# Patient Record
Sex: Female | Born: 1995 | Race: White | Hispanic: No | Smoking: Former smoker
Health system: Southern US, Community
[De-identification: ages and names within clinical notes are randomized; demographics above are authoritative.]

## PROBLEM LIST (undated history)

## (undated) DIAGNOSIS — F319 Bipolar disorder, unspecified: Secondary | ICD-10-CM

## (undated) DIAGNOSIS — G43109 Migraine with aura, not intractable, without status migrainosus: Secondary | ICD-10-CM

## (undated) DIAGNOSIS — E039 Hypothyroidism, unspecified: Secondary | ICD-10-CM

## (undated) DIAGNOSIS — F410 Panic disorder [episodic paroxysmal anxiety] without agoraphobia: Secondary | ICD-10-CM

## (undated) DIAGNOSIS — Z62810 Personal history of physical and sexual abuse in childhood: Secondary | ICD-10-CM

## (undated) DIAGNOSIS — R443 Hallucinations, unspecified: Secondary | ICD-10-CM

## (undated) DIAGNOSIS — R55 Syncope and collapse: Secondary | ICD-10-CM

## (undated) DIAGNOSIS — I959 Hypotension, unspecified: Secondary | ICD-10-CM

## (undated) DIAGNOSIS — Z8719 Personal history of other diseases of the digestive system: Secondary | ICD-10-CM

## (undated) HISTORY — DX: Hallucinations, unspecified: R44.3

## (undated) HISTORY — DX: Personal history of physical and sexual abuse in childhood: Z62.810

## (undated) HISTORY — DX: Personal history of other diseases of the digestive system: Z87.19

## (undated) HISTORY — DX: Panic disorder (episodic paroxysmal anxiety): F41.0

## (undated) HISTORY — DX: Bipolar disorder, unspecified: F31.9

## (undated) HISTORY — DX: Syncope and collapse: R55

## (undated) HISTORY — DX: Hypotension, unspecified: I95.9

## (undated) HISTORY — DX: Migraine with aura, not intractable, without status migrainosus: G43.109

---
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
## 2016-01-05 ENCOUNTER — Encounter: Payer: Self-pay | Admitting: Certified Nurse Midwife

## 2016-01-05 ENCOUNTER — Ambulatory Visit (INDEPENDENT_AMBULATORY_CARE_PROVIDER_SITE_OTHER): Payer: 59 | Admitting: Certified Nurse Midwife

## 2016-01-05 VITALS — BP 94/60 | HR 68 | Resp 16 | Ht 61.75 in | Wt 106.0 lb

## 2016-01-05 DIAGNOSIS — Z01419 Encounter for gynecological examination (general) (routine) without abnormal findings: Secondary | ICD-10-CM

## 2016-01-05 DIAGNOSIS — Z30011 Encounter for initial prescription of contraceptive pills: Secondary | ICD-10-CM | POA: Diagnosis not present

## 2016-01-05 MED ORDER — NORETHIN ACE-ETH ESTRAD-FE 1-20 MG-MCG PO TABS: 1.0000 | ORAL_TABLET | Freq: Every day | ORAL | Status: DC
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
## 2016-01-05 NOTE — Patient Instructions (Signed)
General topics  Next pap or exam is  due in 1 year Take a Women's multivitamin Take 1200 mg. of calcium daily - prefer dietary If any concerns in interim to call back  Breast Self-Awareness Practicing breast self-awareness may pick up problems early, prevent significant medical complications, and possibly save your life. By practicing breast self-awareness, you can become familiar with how your breasts look and feel and if your breasts are changing. This allows you to notice changes early. It can also offer you some reassurance that your breast health is good. One way to learn what is normal for your breasts and whether your breasts are changing is to do a breast self-exam. If you find a lump or something that was not present in the past, it is best to contact your caregiver right away. Other findings that should be evaluated by your caregiver include nipple discharge, especially if it is bloody; skin changes or reddening; areas where the skin seems to be pulled in (retracted); or new lumps and bumps. Breast pain is seldom associated with cancer (malignancy), but should also be evaluated by a caregiver. BREAST SELF-EXAM The best time to examine your breasts is 5 7 days after your menstrual period is over.  ExitCare Patient Information 2013 [employer].   Exercise to Stay Healthy Exercise helps you become and stay healthy. EXERCISE IDEAS AND TIPS Choose exercises that:  You enjoy.  Fit into your day. You do not need to exercise really hard to be healthy. You can do exercises at a slow or medium level and stay healthy. You can:  Stretch before and after working out.  Try yoga, Pilates, or tai chi.  Lift weights.  Walk fast, swim, jog, run, climb stairs, bicycle, dance, or rollerskate.  Take aerobic classes. Exercises that burn about 150 calories:  Running 1  miles in 15 minutes.  Playing volleyball for 45 to 60 minutes.  Washing and waxing a car for 45 to 60  minutes.  Playing touch football for 45 minutes.  Walking 1  miles in 35 minutes.  Pushing a stroller 1  miles in 30 minutes.  Playing basketball for 30 minutes.  Raking leaves for 30 minutes.  Bicycling 5 miles in 30 minutes.  Walking 2 miles in 30 minutes.  Dancing for 30 minutes.  Shoveling snow for 15 minutes.  Swimming laps for 20 minutes.  Walking up stairs for 15 minutes.  Bicycling 4 miles in 15 minutes.  Gardening for 30 to 45 minutes.  Jumping rope for 15 minutes.  Washing windows or floors for 45 to 60 minutes. Document Released: 12/28/2010 Document Revised: 02/17/2012 Document Reviewed: 12/28/2010 ExitCare Patient Information 2013 [employer]Care[employer].   Other topics ( that may be useful information):    Sexually Transmitted Disease Sexually transmitted disease (STD) refers to any infection that is passed from person to person during sexual activity. This may happen by way of saliva, semen, blood, vaginal mucus, or urine. Common STDs include:  Gonorrhea.  Chlamydia.  Syphilis.  HIV/AIDS.  Genital herpes.  Hepatitis B and C.  Trichomonas.  Human papillomavirus (HPV).  Pubic lice. CAUSES  An STD may be spread by bacteria, virus, or parasite. A person can get an STD by:  Sexual intercourse with an infected person.  Sharing sex toys with an infected person.  Sharing needles with an infected person.  Having intimate contact with the genitals, mouth, or rectal areas of an infected person. SYMPTOMS  Some people may not have any symptoms, but   they can still pass the infection to others. Different STDs have different symptoms. Symptoms include:  Painful or bloody urination.  Pain in the pelvis, abdomen, vagina, anus, throat, or eyes.  Skin rash, itching, irritation, growths, or sores (lesions). These usually occur in the genital or anal area.  Abnormal vaginal discharge.  Penile discharge in men.  Soft, flesh-colored skin growths in the  genital or anal area.  Fever.  Pain or bleeding during sexual intercourse.  Swollen glands in the groin area.  Yellow skin and eyes (jaundice). This is seen with hepatitis. DIAGNOSIS  To make a diagnosis, your caregiver may:  Take a medical history.  Perform a physical exam.  Take a specimen (culture) to be examined.  Examine a sample of discharge under a microscope.  Perform blood test TREATMENT   Chlamydia, gonorrhea, trichomonas, and syphilis can be cured with antibiotic medicine.  Genital herpes, hepatitis, and HIV can be treated, but not cured, with prescribed medicines. The medicines will lessen the symptoms.  Genital warts from HPV can be treated with medicine or by freezing, burning (electrocautery), or surgery. Warts may come back.  HPV is a virus and cannot be cured with medicine or surgery.However, abnormal areas may be followed very closely by your caregiver and may be removed from the cervix, vagina, or vulva through office procedures or surgery. If your diagnosis is confirmed, your recent sexual partners need treatment. This is true even if they are symptom-free or have a negative culture or evaluation. They should not have sex until their caregiver says it is okay. HOME CARE INSTRUCTIONS  All sexual partners should be informed, tested, and treated for all STDs.  Take your antibiotics as directed. Finish them even if you start to feel better.  Only take over-the-counter or prescription medicines for pain, discomfort, or fever as directed by your caregiver.  Rest.  Eat a balanced diet and drink enough fluids to keep your urine clear or pale yellow.  Do not have sex until treatment is completed and you have followed up with your caregiver. STDs should be checked after treatment.  Keep all follow-up appointments, Pap tests, and blood tests as directed by your caregiver.  Only use latex condoms and water-soluble lubricants during sexual activity. Do not use  petroleum jelly or oils.  Avoid alcohol and illegal drugs.  Get vaccinated for HPV and hepatitis. If you have not received these vaccines in the past, talk to your caregiver about whether one or both might be right for you.  Avoid risky sex practices that can break the skin. The only way to avoid getting an STD is to avoid all sexual activity.Latex condoms and dental dams (for oral sex) will help lessen the risk of getting an STD, but will not completely eliminate the risk. SEEK MEDICAL CARE IF:   You have a fever.  You have any new or worsening symptoms. Document Released: 02/15/2003 Document Revised: 02/17/2012 Document Reviewed: 02/22/2011 [facility] Patient Information 2013 [place].    Domestic Abuse You are being battered or abused if someone close to you hits, pushes, or physically hurts you in any way. You also are being abused if you are forced into activities. You are being sexually abused if you are forced to have sexual contact of any kind. You are being emotionally abused if you are made to feel worthless or if you are constantly threatened. It is important to remember that help is available. No one has the right to abuse you. PREVENTION OF FURTHER  ABUSE  Learn the warning signs of danger. This varies with situations but may include: the use of alcohol, threats, isolation from friends and family, or forced sexual contact. Leave if you feel that violence is going to occur.  If you are attacked or beaten, report it to the police so the abuse is documented. You do not have to press charges. The police can protect you while you or the attackers are leaving. Get the officer's name and badge number and a copy of the report.  Find someone you can trust and tell them what is happening to you: your caregiver, a nurse, clergy member, close friend or family member. Feeling ashamed is natural, but remember that you have done nothing wrong. No one deserves abuse. Document Released:  11/22/2000 Document Revised: 02/17/2012 Document Reviewed: 01/31/2011 ExitCare Patient Information 2013 ExitCare[employer].    How Much is Too Much Alcohol? Drinking too much alcohol can cause injury, accidents, and health problems. These types of problems can include:   Car crashes.  Falls.  Family fighting (domestic violence).  Drowning.  Fights.  Injuries.  Burns.  Damage to certain organs.  Having a baby with birth defects. ONE DRINK CAN BE TOO MUCH WHEN YOU ARE:  Working.  Pregnant or breastfeeding.  Taking medicines. Ask your doctor.  Driving or planning to drive. If you or someone you know has a drinking problem, get help from a doctor.  Document Released: 09/21/2009 Document Revised: 02/17/2012 Document Reviewed: 09/21/2009 ExitCare Patient Information 2013 ExitCare[employer].   Smoking Hazards Smoking cigarettes is extremely bad for your health. Tobacco smoke has over 200 known poisons in it. There are over 60 chemicals in tobacco smoke that cause cancer. Some of the chemicals found in cigarette smoke include:   Cyanide.  Benzene.  Formaldehyde.  Methanol (wood alcohol).  Acetylene (fuel used in welding torches).  Ammonia. Cigarette smoke also contains the poisonous gases nitrogen oxide and carbon monoxide.  Cigarette smokers have an increased risk of many serious medical problems and Smoking causes approximately:  90% of all lung cancer deaths in men.  80% of all lung cancer deaths in women.  90% of deaths from chronic obstructive lung disease. Compared with nonsmokers, smoking increases the risk of:  Coronary heart disease by 2 to 4 times.  Stroke by 2 to 4 times.  Men developing lung cancer by 23 times.  Women developing lung cancer by 13 times.  Dying from chronic obstructive lung diseases by 12 times.  . Smoking is the most preventable cause of death and disease in our society.  WHY IS SMOKING ADDICTIVE?  Nicotine is the chemical  agent in tobacco that is capable of causing addiction or dependence.  When you smoke and inhale, nicotine is absorbed rapidly into the bloodstream through your lungs. Nicotine absorbed through the lungs is capable of creating a powerful addiction. Both inhaled and non-inhaled nicotine may be addictive.  Addiction studies of cigarettes and spit tobacco show that addiction to nicotine occurs mainly during the teen years, when young people begin using tobacco products. WHAT ARE THE BENEFITS OF QUITTING?  There are many health benefits to quitting smoking.   Likelihood of developing cancer and heart disease decreases. Health improvements are seen almost immediately.  Blood pressure, pulse rate, and breathing patterns start returning to normal soon after quitting. QUITTING SMOKING   American Lung Association - 1-800-LUNGUSA  American Cancer Society - 1-800-ACS-2345 Document Released: 01/02/2005 Document Revised: 02/17/2012 Document Reviewed: 09/06/2009 ExitCare Patient Information 2013 ExitCare,   LLC.   Stress Management Stress is a state of physical or mental tension that often results from changes in your life or normal routine. Some common causes of stress are:  Death of a loved one.  Injuries or severe illnesses.  Getting fired or changing jobs.  Moving into a new home. Other causes may be:  Sexual problems.  Business or financial losses.  Taking on a large debt.  Regular conflict with someone at home or at work.  Constant tiredness from lack of sleep. It is not just bad things that are stressful. It may be stressful to:  Win the lottery.  Get married.  Buy a new car. The amount of stress that can be easily tolerated varies from person to person. Changes generally cause stress, regardless of the types of change. Too much stress can affect your health. It may lead to physical or emotional problems. Too little stress (boredom) may also become stressful. SUGGESTIONS TO  REDUCE STRESS:  Talk things over with your family and friends. It often is helpful to share your concerns and worries. If you feel your problem is serious, you may want to get help from a professional counselor.  Consider your problems one at a time instead of lumping them all together. Trying to take care of everything at once may seem impossible. List all the things you need to do and then start with the most important one. Set a goal to accomplish 2 or 3 things each day. If you expect to do too many in a single day you will naturally fail, causing you to feel even more stressed.  Do not use alcohol or drugs to relieve stress. Although you may feel better for a short time, they do not remove the problems that caused the stress. They can also be habit forming.  Exercise regularly - at least 3 times per week. Physical exercise can help to relieve that "uptight" feeling and will relax you.  The shortest distance between despair and hope is often a good night's sleep.  Go to bed and get up on time allowing yourself time for appointments without being rushed.  Take a short "time-out" period from any stressful situation that occurs during the day. Close your eyes and take some deep breaths. Starting with the muscles in your face, tense them, hold it for a few seconds, then relax. Repeat this with the muscles in your neck, shoulders, hand, stomach, back and legs.  Take good care of yourself. Eat a balanced diet and get plenty of rest.  Schedule time for having fun. Take a break from your daily routine to relax. HOME CARE INSTRUCTIONS   Call if you feel overwhelmed by your problems and feel you can no longer manage them on your own.  Return immediately if you feel like hurting yourself or someone else. Document Released: 05/21/2001 Document Revised: 02/17/2012 Document Reviewed: 01/11/2008 [facility] Patient Information 2013 [place].

## 2016-01-07 NOTE — Progress Notes (Signed)
Reviewed personally.  M. Suzanne Jacobe Study, MD.  

## 2016-02-07 ENCOUNTER — Telehealth: Payer: Self-pay | Admitting: Certified Nurse Midwife

## 2016-02-07 NOTE — Telephone Encounter (Addendum)
Patient's mom Foye Clock called to talk with the nurse about her daughter. She is having irregular bleeding and fainted last week. She is on the DPR to speak with her.

## 2016-02-07 NOTE — Telephone Encounter (Signed)
Returned call to mother Foye Clock at number provided [phone]. Left message to call Kaitlyn at [phone].

## 2016-02-09 NOTE — Telephone Encounter (Signed)
Left message to call Kunal Levario at [phone]. 

## 2016-02-12 NOTE — Telephone Encounter (Signed)
Left message to call Burnette Valenti at [phone]. 

## 2016-02-15 NOTE — Telephone Encounter (Signed)
Leota Sauerseborah Leonard CNM, I have attempted to reach the patient's mother Foye ClockKristina x 3 with no return call. Patient's telephone number listed on DPR is the same as her mothers. I have not received a return call. Please advise next steps.

## 2016-02-15 NOTE — Telephone Encounter (Signed)
Do not see any outside notes from ER, so would assume if still a problem she would call. We could send a letter, but think we can close encounter

## 2016-02-16 ENCOUNTER — Ambulatory Visit (INDEPENDENT_AMBULATORY_CARE_PROVIDER_SITE_OTHER): Payer: 59 | Admitting: Certified Nurse Midwife

## 2016-02-16 ENCOUNTER — Encounter: Payer: Self-pay | Admitting: Certified Nurse Midwife

## 2016-02-16 VITALS — BP 100/60 | HR 68 | Temp 98.4°F | Resp 16 | Ht 61.75 in | Wt 111.0 lb

## 2016-02-16 DIAGNOSIS — Z113 Encounter for screening for infections with a predominantly sexual mode of transmission: Secondary | ICD-10-CM | POA: Diagnosis not present

## 2016-02-16 DIAGNOSIS — R309 Painful micturition, unspecified: Secondary | ICD-10-CM

## 2016-02-16 DIAGNOSIS — A499 Bacterial infection, unspecified: Secondary | ICD-10-CM

## 2016-02-16 DIAGNOSIS — N760 Acute vaginitis: Secondary | ICD-10-CM

## 2016-02-16 DIAGNOSIS — B9689 Other specified bacterial agents as the cause of diseases classified elsewhere: Secondary | ICD-10-CM

## 2016-02-16 LAB — POCT URINALYSIS DIPSTICK
Bilirubin, UA: NEGATIVE
Blood, UA: NEGATIVE
Glucose, UA: NEGATIVE
Ketones, UA: NEGATIVE
Nitrite, UA: NEGATIVE
Protein, UA: NEGATIVE
Urobilinogen, UA: NEGATIVE
pH, UA: 5

## 2016-02-16 MED ORDER — METRONIDAZOLE 500 MG PO TABS: 500.0000 mg | ORAL_TABLET | Freq: Two times a day (BID) | ORAL | Status: DC

## 2016-02-16 NOTE — Patient Instructions (Signed)

## 2016-02-16 NOTE — Progress Notes (Signed)
20 y.o. Single Caucasian female G0P0000 here with complaint of urethral pain? and some bleeding noted from area and in toilet. Sexually active prior to occurrence, with oral stimulation and hand stimulation with new partner. Denies fever, chills, nausea or back pain.  Complaining of vaginal symptoms of itching, burning, and increase discharge. Describes discharge as watery, no odor.Onset of symptoms 3 days ago. Denies new personal products. Has  STD concerns, new partner, desires STD screening vaginal only. Urinary symptoms none . Contraception is OCP.   O:Healthy female WDWN Affect: normal, orientation x 3  Exam:skin: warm and dry CVAT negative bilateral Abdomen: soft, non tender, negative suprapubic Lymph node: no enlargement or tenderness Pelvic exam: External genital: normal female,  No lesions BUS: negative Bladder and urethral meatus non tender, no blood noted Small healing superficial laceration noted under clitoral hood, slightly tender Vagina: grey watery  discharge noted. Ph:4.5   ,Wet prep taken, Affirm taken Cervix: normal, non tender,very slight CMT Uterus: normal, non tender Adnexa:normal, non tender, no masses or fullness noted   Wet Prep results: positive for clue   A:Normal pelvic exam BV STD screening Superficial laceration under clitoral hood  R/O UTI   P:Discussed findings of BV and etiology. Discussed Aveeno or baking soda sitz bath for comfort.  Rx Flagyl see order with instructions Discussed superficial laceration noted under clitoral hood and avoid hand or oral stimulation to allow healing. Would recommend avoiding to avoid trauma to area. Sitz bath will help with comfort. Stressed condom use for STD protection Lab: GC,Chlamydia, affirm, UTI warning signs given and need to advise. Lab; Urine culture   Rv prn

## 2016-02-17 LAB — WET PREP BY MOLECULAR PROBE
Candida species: POSITIVE — AB
Gardnerella vaginalis: NEGATIVE
Trichomonas vaginosis: NEGATIVE

## 2016-02-17 LAB — URINE CULTURE: Colony Count: 8000

## 2016-02-18 ENCOUNTER — Other Ambulatory Visit: Payer: Self-pay | Admitting: Certified Nurse Midwife

## 2016-02-18 DIAGNOSIS — B3731 Acute candidiasis of vulva and vagina: Secondary | ICD-10-CM

## 2016-02-18 DIAGNOSIS — B373 Candidiasis of vulva and vagina: Secondary | ICD-10-CM

## 2016-02-18 MED ORDER — TERCONAZOLE 0.4 % VA CREA: 1.0000 | TOPICAL_CREAM | Freq: Every day | VAGINAL | Status: DC

## 2016-02-20 LAB — IPS N GONORRHOEA AND CHLAMYDIA BY PCR

## 2016-02-21 NOTE — Progress Notes (Signed)
Encounter reviewed Dana Julian, MD   

## 2016-03-04 ENCOUNTER — Ambulatory Visit (INDEPENDENT_AMBULATORY_CARE_PROVIDER_SITE_OTHER): Payer: 59 | Admitting: Family Medicine

## 2016-03-04 ENCOUNTER — Encounter: Payer: Self-pay | Admitting: Family Medicine

## 2016-03-04 VITALS — BP 98/64 | HR 60 | Temp 98.4°F | Wt 112.4 lb

## 2016-03-04 DIAGNOSIS — Z7189 Other specified counseling: Secondary | ICD-10-CM | POA: Diagnosis not present

## 2016-03-04 DIAGNOSIS — R6889 Other general symptoms and signs: Secondary | ICD-10-CM | POA: Diagnosis not present

## 2016-03-04 DIAGNOSIS — R55 Syncope and collapse: Secondary | ICD-10-CM | POA: Diagnosis not present

## 2016-03-04 DIAGNOSIS — Z7689 Persons encountering health services in other specified circumstances: Secondary | ICD-10-CM

## 2016-03-04 DIAGNOSIS — I951 Orthostatic hypotension: Secondary | ICD-10-CM | POA: Diagnosis not present

## 2016-03-04 DIAGNOSIS — Z862 Personal history of diseases of the blood and blood-forming organs and certain disorders involving the immune mechanism: Secondary | ICD-10-CM

## 2016-03-04 DIAGNOSIS — F319 Bipolar disorder, unspecified: Secondary | ICD-10-CM

## 2016-03-04 LAB — CBC WITH DIFFERENTIAL/PLATELET
Basophils Absolute: 0 10*3/uL (ref 0.0–0.1)
Basophils Relative: 0 % (ref 0–1)
Eosinophils Absolute: 0.1 10*3/uL (ref 0.0–0.7)
Eosinophils Relative: 1 % (ref 0–5)
HCT: 40.9 % (ref 36.0–46.0)
Hemoglobin: 13.7 g/dL (ref 12.0–15.0)
Lymphocytes Relative: 36 % (ref 12–46)
Lymphs Abs: 2.2 10*3/uL (ref 0.7–4.0)
MCH: 28.7 pg (ref 26.0–34.0)
MCHC: 33.5 g/dL (ref 30.0–36.0)
MCV: 85.6 fL (ref 78.0–100.0)
MPV: 9.4 fL (ref 8.6–12.4)
Monocytes Absolute: 0.4 10*3/uL (ref 0.1–1.0)
Monocytes Relative: 7 % (ref 3–12)
Neutro Abs: 3.4 10*3/uL (ref 1.7–7.7)
Neutrophils Relative %: 56 % (ref 43–77)
Platelets: 292 10*3/uL (ref 150–400)
RBC: 4.78 MIL/uL (ref 3.87–5.11)
RDW: 12.7 % (ref 11.5–15.5)
WBC: 6 10*3/uL (ref 4.0–10.5)

## 2016-03-04 NOTE — Progress Notes (Signed)
Subjective:    Patient ID: Dana Fisher, female    DOB: 05-01-1996, 20 y.o.   MRN: [id number]  HPI Chief Complaint  Patient presents with  . new pt    new pt fainting friday- on new bipolor meds back in january, and fainted twice- feels bp dropping, dizzy,couldn't see anything. had fainting spells back in elemenary and middle school. had pediatric cardiology work up and everything was fine several years ago   She is an 20 year old female with a history of Bipolar disorder, GAD, panic disorder, hypotension, paranoia, and SI attempt in 11/2015, who is new to the practice and here to establish primary care. States she has been having dizzy episodes and "blacking out". States she has had 2 episodes, both at work and 1st one 1 month ago and the last 3 days ago. Reports history of same but states no episodes since 8th grade, which has been approximately 3-4 years. States 1 month ago she was at work and was on her menstrual cycle and had severe cramping. Felt cold and clammy and then she passed out, landed on the floor, witnessed. Denies being injured. Ems was not called. She remembers waking up on the floor.  She states Friday she was at work and she felt "numb" and her vision went black, felt like her blood pressure was low. She reports waking up on the floor again and EMS was not called and she did not go for medical evaluation. She denies injury either episode.  She states today she feels fine. Denies fever, chills, headache, dizziness, chest pain, palpitations, shortness of breath, GI or GU symptoms.  Has not been to primary care. Has seen pediatrician in past at [facility] but several years ago. No records with her today.  Past medical history: states she was diagnosed with "low blood pressure" and this was treated with high salt diet and high fluids.  Has been feeling really cold for the past 2 weeks.   States she was diagnosed with bipolar in January and started new medications in  January. She is under the care of Psychiatrist at [facility].   She is working at [employer] and has been having more caffeine than usual. Her mother thinks this may be related to her syncopal episodes.  States she snacks often, eats a lot of pancakes, hot pockets, bacon, eggs, panini, states she is lactose intolerant.  Ports history of anemia and was prescribed daily iron. She states she takes this occasionally.  Mother states she was evaluated by a pediatric cardiologist in past at age 90-11. He diagnosed her with orthostatic hypotension and  ? Long Q-T.    Takes OCPs.  LMP: 1 week ago.   Other providers: Sara Chu- [facility]. Psychiatrist Shelba Flake at [facility].   Denies smoking, alcohol use. Marijuana once daily.    Review of Systems Pertinent positives and negatives in the history of present illness.     Objective:   Physical Exam Wt 112 lb 6.4 oz (50.984 kg)  LMP 01/26/2016  Alert and in no distress.  Cardiac exam shows a regular sinus rhythm without murmurs or gallops. Lungs are clear to auscultation.   Orthostatic blood pressures done: she is not postural ECG: indication syncope, history of hypotension NSR Rate 64    Assessment & Plan:  Syncope, unspecified syncope type - Plan: EKG 12-Lead, CBC with Differential/Platelet, Comprehensive metabolic panel, Orthostatic vital signs, CANCELED: POCT urine pregnancy  Encounter to establish care  Orthostatic hypotension -  Plan: Orthostatic vital signs  History of anemia - Plan: CBC with Differential/Platelet  Cold intolerance - Plan: TSH  Bipolar I disorder (HCC)  Discussed that I would like to get her medical records from her pediatrician and pediatric cardiologist. She is not orthostatic today and her EKG is unremarkable. Recommend lab work and she will let me know if she has another episode. Discussed that ideally she would be able to have her vitals taken if she does have another episode since she  feels like low blood pressure is the cause for her syncopal episodes. Recommend that she continue on high salt and make sure he or she is drinking plenty of fluids since this has been recommended in the past for her symptoms.  Discussed that I will defer to her psychiatrist for treatment of bipolar and anxiety. Follow-up pending blood work and medical records.

## 2016-03-05 ENCOUNTER — Telehealth: Payer: Self-pay | Admitting: Family Medicine

## 2016-03-05 LAB — COMPREHENSIVE METABOLIC PANEL
ALT: 7 U/L (ref 5–32)
AST: 13 U/L (ref 12–32)
Albumin: 4 g/dL (ref 3.6–5.1)
Alkaline Phosphatase: 44 U/L — ABNORMAL LOW (ref 47–176)
BUN: 8 mg/dL (ref 7–20)
CO2: 23 mmol/L (ref 20–31)
Calcium: 9.1 mg/dL (ref 8.9–10.4)
Chloride: 105 mmol/L (ref 98–110)
Creat: 0.68 mg/dL (ref 0.50–1.00)
Glucose, Bld: 80 mg/dL (ref 65–99)
Potassium: 4 mmol/L (ref 3.8–5.1)
Sodium: 138 mmol/L (ref 135–146)
Total Bilirubin: 0.5 mg/dL (ref 0.2–1.1)
Total Protein: 6.6 g/dL (ref 6.3–8.2)

## 2016-03-05 LAB — TSH: TSH: 3.3 mIU/L (ref 0.50–4.30)

## 2016-03-05 NOTE — Telephone Encounter (Signed)
[facility]/office notes, immunizations

## 2016-04-29 ENCOUNTER — Ambulatory Visit (HOSPITAL_COMMUNITY)
Admission: RE | Admit: 2016-04-29 | Discharge: 2016-04-29 | Disposition: A | Discharge status: Home / Self Care | Payer: 59 | Attending: Psychiatry | Admitting: Psychiatry

## 2016-04-29 DIAGNOSIS — R55 Syncope and collapse: Secondary | ICD-10-CM | POA: Insufficient documentation

## 2016-04-29 DIAGNOSIS — F319 Bipolar disorder, unspecified: Secondary | ICD-10-CM | POA: Insufficient documentation

## 2016-04-29 DIAGNOSIS — F12288 Cannabis dependence with other cannabis-induced disorder: Secondary | ICD-10-CM | POA: Diagnosis not present

## 2016-04-29 DIAGNOSIS — R443 Hallucinations, unspecified: Secondary | ICD-10-CM | POA: Diagnosis not present

## 2016-04-29 DIAGNOSIS — I959 Hypotension, unspecified: Secondary | ICD-10-CM | POA: Insufficient documentation

## 2016-04-29 DIAGNOSIS — F17210 Nicotine dependence, cigarettes, uncomplicated: Secondary | ICD-10-CM | POA: Insufficient documentation

## 2016-04-29 DIAGNOSIS — Z62810 Personal history of physical and sexual abuse in childhood: Secondary | ICD-10-CM | POA: Diagnosis not present

## 2016-04-29 DIAGNOSIS — F410 Panic disorder [episodic paroxysmal anxiety] without agoraphobia: Secondary | ICD-10-CM | POA: Insufficient documentation
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
## 2016-04-30 ENCOUNTER — Other Ambulatory Visit (HOSPITAL_COMMUNITY): Payer: 59 | Admitting: Licensed Clinical Social Worker

## 2016-04-30 DIAGNOSIS — F3181 Bipolar II disorder: Secondary | ICD-10-CM | POA: Insufficient documentation

## 2016-04-30 DIAGNOSIS — F32A Depression, unspecified: Secondary | ICD-10-CM

## 2016-04-30 DIAGNOSIS — F411 Generalized anxiety disorder: Secondary | ICD-10-CM

## 2016-04-30 DIAGNOSIS — F329 Major depressive disorder, single episode, unspecified: Secondary | ICD-10-CM

## 2016-05-01 ENCOUNTER — Other Ambulatory Visit (HOSPITAL_COMMUNITY): Payer: 59 | Attending: Psychiatry | Admitting: Licensed Clinical Social Worker

## 2016-05-01 ENCOUNTER — Other Ambulatory Visit (HOSPITAL_COMMUNITY): Payer: 59 | Admitting: Licensed Clinical Social Worker

## 2016-05-01 DIAGNOSIS — F3181 Bipolar II disorder: Secondary | ICD-10-CM | POA: Diagnosis present
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
## 2016-05-02 ENCOUNTER — Encounter (HOSPITAL_COMMUNITY): Payer: Self-pay | Admitting: Licensed Clinical Social Worker

## 2016-05-02 ENCOUNTER — Other Ambulatory Visit (HOSPITAL_COMMUNITY): Payer: 59 | Admitting: Licensed Clinical Social Worker

## 2016-05-02 ENCOUNTER — Other Ambulatory Visit (HOSPITAL_COMMUNITY): Payer: 59

## 2016-05-02 DIAGNOSIS — F3181 Bipolar II disorder: Secondary | ICD-10-CM

## 2016-05-02 MED ORDER — OLANZAPINE 10 MG PO TABS: 10.0000 mg | ORAL_TABLET | Freq: Every day | ORAL | Status: DC

## 2016-05-02 MED ORDER — GABAPENTIN 100 MG PO CAPS: 100.0000 mg | ORAL_CAPSULE | Freq: Three times a day (TID) | ORAL | Status: DC

## 2016-05-02 NOTE — Progress Notes (Signed)
Group Note:  Patient attended the group today. Was withdrawn and quiet and did not volunteer  any information without being coaxed. Poor eye contact keeping her eyes fixed to the floor. Did say, after much encouragement, that the reason she was here in the hospital was because "I have bad thoughts and just don't want to be here". In a simple exercise where Pt's give their hearts to the leader so that they can only use their heads and not function from their hearts, Pt was able to state that she was indeed equal to others in the group. Affect remained flat and mood depressed throughout the group.
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
## 2016-05-02 NOTE — Psych (Signed)
   [facility] BH PHP THERAPIST PROGRESS NOTE  Dana Fisher [id number]  Session Time: 9 AM - 2 PM  Participation Level: Minimal  Behavioral Response: CasualAlertDepressed  Type of Therapy: Group Therapy  Treatment Goals addressed: Anxiety and Coping, Depression  Interventions: CBT, DBT, Strength-based, Supportive and Reframing  Summary: Clinician facilitated check-in regarding current stressors and situation, and review of patient completed diary card. Clinician utilized active listening and empathetic response and validated patient emotions. Group engaged in "Wear Sunscreen" mindfulness activity and discussed progress.  Clinician reviewed topic of emotional regulation and pointed out moments in which the skill could have been utilized.  Group members participated in activity with RN on choices and needs.  Group participated in graduation ceremony for fellow group member.  Group met with OT for group session.  Clinician assessed for immediate needs, medication compliance and efficacy, and safety concerns.    Suicidal/Homicidal: Nowithout intent/plan  Therapist Response:  Dana Fisher is a 20 y.o. female who presents with depression and anxiety symptoms. Patient was on time and low energy in group today. Patient reports "mild" anxiety and depression symptoms and that she felt "a little" sad and happy. Patient stated frustration after making a poor grade on her exam yesterday. Patient identifies she is able to retake the exam today to improve her grade and that it is open notes and she knows where to find the answers, however is not sure she will look for them as "it would take forever to go through all of the modules." Patient seeks to discuss risky behaviors in group such as past drug use and her previous job. Patient stops participating in group and responds with one word answers after clinician redirected her from those topics during check-in.  Patient was present for RN group, graduation  ceremony, and OT group.  Patient demonstrates lack of progress as evidenced by lack of participation and engagement in group. Patient was not disruptive, and exhibited treatment interfering behaviors. Patient denies SI/HI/self-harm thoughts.    Plan: Patient will continue in PHP and medication management. Work towards decreasing depression and anxiety symptoms and increase emotional regulation and positive coping skills   Diagnosis: Primary Diagnosis: Bipolar 2 disorder, major depressive episode ([place]) [F31.81]    1. Bipolar 2 disorder, major depressive episode ([place])       Lorin Glass, LCSW 05/02/2016

## 2016-05-03 ENCOUNTER — Other Ambulatory Visit (HOSPITAL_COMMUNITY): Payer: 59

## 2016-05-06 ENCOUNTER — Other Ambulatory Visit (HOSPITAL_COMMUNITY): Payer: Self-pay

## 2016-05-07 ENCOUNTER — Other Ambulatory Visit (HOSPITAL_COMMUNITY): Payer: Self-pay

## 2016-05-08 ENCOUNTER — Other Ambulatory Visit (HOSPITAL_COMMUNITY): Payer: 59 | Admitting: Psychiatry

## 2016-05-08 ENCOUNTER — Encounter (HOSPITAL_COMMUNITY): Payer: Self-pay

## 2016-05-08 DIAGNOSIS — F3181 Bipolar II disorder: Secondary | ICD-10-CM | POA: Diagnosis not present

## 2016-05-08 NOTE — Psych (Signed)
   [facility]  Dana Dibblelexis C Randon [id number]  Session Time: 9 AM - 2 PM  Participation Level: Active  Behavioral Response: CasualAlertDepressed  Type of Therapy: Group Therapy  Treatment Goals addressed: Anxiety and Coping, Depression  Interventions: CBT, DBT, Strength-based, Supportive and Reframing  Summary:  Clinician facilitated check-in regarding current stressors and situation, and review of patient completed diary card. Clinician utilized active listening and empathetic response and validated patient emotions. Clinician showed TED talk "The power of mindfulness; What you practice grows stronger" to explain the benefits of mindfulness on the brain. Group engaged in coloring mindfulness activity and discussed progress.  Clinician introduced topic of emotional regulation. Clinician utilized handout, "Emotional Regulation" and group reviewed the skills of "PLEASE," "Paying attention to Positive Events," "Opposite Action," and "Check the Facts." Clinician educated group on how each skill worked and when it might be used. Group provided examples of when they could utilize these skills.  Clinician facilitated art therapy activity of "mind mapping" in which each group member utilized multi-media to illustrate their inner mind. Group members presented their finished product to group and clinician facilitated conversation regarding their creative choices and their meaning.  Clinician assessed for immediate needs, medication compliance and efficacy, and safety concerns.   Suicidal/Homicidal: Nowithout intent/plan  Therapist Response: Dana Fisher is a 20 y.o. female who presents with depressive symptoms. Patient presented on time and with depressed affect. Patient reports feeling "fine I guess." Patient reports "neutral" feelings and states she felt "a little" sad and happy yesterday. Patient denies concerns or problems.  Patient minimally engaged in discussion regarding  emotional regulation and states she "stayed okay" and couldn't think of a time when she could use these skills. Patient appropriately engaged in art therapy activity. Patient presented a "mind map" that was hand-drawn, utilized memes and quotes she liked, and had 3-4 drug related pictures. Patient reports "that doesn't mean anything" and denies having issues with substances.  This is patient's first session of group. Patient had low energy, poor eye contact, and had incongruent affect as demonstrated by smiling when nothing was happening in group or when other group members were sharing problems. Patient denies SI/HI/self-harm thoughts.     Plan: Patient will continue in PHP and medication management. Work towards decreasing depression and anxiety symptoms and increase emotional regulation and positive coping skills.    Diagnosis: Primary Diagnosis: Bipolar 2 disorder, major depressive episode (HCC) [F31.81]    1. Bipolar 2 disorder, major depressive episode (HCC)       [facility] 05/08/2016

## 2016-05-09 ENCOUNTER — Encounter (HOSPITAL_COMMUNITY): Payer: Self-pay | Admitting: Licensed Clinical Social Worker

## 2016-05-09 ENCOUNTER — Encounter (HOSPITAL_COMMUNITY): Payer: Self-pay | Admitting: Psychiatry

## 2016-05-09 ENCOUNTER — Other Ambulatory Visit (HOSPITAL_COMMUNITY): Payer: 59 | Attending: Psychiatry | Admitting: Licensed Clinical Social Worker

## 2016-05-09 DIAGNOSIS — F3181 Bipolar II disorder: Secondary | ICD-10-CM | POA: Insufficient documentation

## 2016-05-09 NOTE — Progress Notes (Signed)
Patient ID: Dana Fisher, female   DOB: 05-24-1996, 20 y.o.   MRN: [id number] 05/08/16 PHP Psychiatry Progress Note   Duration- 20 minutes   Patient is a 20 year old single female, employed ,  reports history of Bipolar Spectrum Disorder, ADHD, history of Substance Abuse in the past . History of increased depression, anxiety recently.  Please refer to Dr. Lubertha Basqueaylor's initial psychiatric assessment   Subjective - patient reports she is still feeling depressed, anxious, but is improved compared to before, and feels that PHP support, therapy is helping . Denies medication side effects- currently on Zyprexa .   Objective - Patient reports generally improving mood , although reports some ongoing depression, anxiety.  Denies any current suicidal ideations or self injurious ideations. Denies recent drug use. At this time she is future oriented, states she is working on obtaining her HS diploma, and was hoping to go into [employer], but thinks this may not be possible due to her psychiatric history and being on psychiatric medications. She does want to continue on to college once she gets her diploma. 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 At this time denies medication side effects from Olanzapine, which she feels has been effective , does not endorse significant weight gain, sedation, or abnormal involuntary movements.  MSE- Alert, attentive, well groomed, calm, fair eye contact, speech normal in tone, volume, rate, no psychomotor agitation or retardation, mood described as improving , affect mildly constricted, but reactive and does smile appropriately at times, no thought disorder, denies suicidal ideations, denies self injurious ideations at this time, denies homicidal or violent ideations .  Denies hallucinations, no delusions expressed, does not appear guarded or paranoid .Not internally preoccupied. Future oriented . No akathisia noted, no abnormal involuntary movements  noted. Labs from 03/04/16 reviewed- unremarkable .   Assessment - patient reports history of bipolar spectrum disorder, most recently depressed, prior history of  self injurious behaviors, and history of substance abuse. At this time reports partial improvement of mood, states she is feeling better about herself, is more future oriented, and denies any current suicidal or self injurious  Ideations. Currently on Olanzapine, which she has tolerated well thus far .  Plan- continue PHP- as discussed with PHP therapist, disposition planning in progress- plan is to discharge to outpatient psychiatric management and therapy, particularly DBT focused . Does not need medication renewed at this time.  Side effects, to include risk of metabolic disturbances, sedation, weight gain, movement disorder, have been reviewed   Nehemiah MassedFernando Cobos, MD

## 2016-05-09 NOTE — Psych (Signed)
   [facility]  Miguel Dibblelexis C Khim [id number]  Session Time: 9 AM - 2 PM  Participation Level: Minimal  Behavioral Response: CasualAlertDepressed  Type of Therapy: Group Therapy  Treatment Goals addressed: Anxiety and Coping, Depression  Interventions: CBT, DBT, Strength-based, Supportive and Reframing  Summary: Clinician facilitated check-in regarding current stressors and situation, and review of patient completed diary card. Clinician utilized active listening and empathetic response and validated patient emotions. Group engaged in outdoors mindfulness activity and discussed progress.  Group completed "mask" activity in which they drew and discussed what they present to the world versus how they feel inside. Clinician introduced topic of boundaries and provided psycho-education regarding the topic. Clinician utilized handout "What are personal boundaries?" to discuss types of boundaries. Group members discussed how different boundaries played out in their own lives.  Clinician led activity of "boundary mapping" in which group members drew the ways in which their boundaries look with different people and concepts in their lives. Group members then completed the same activity for how they want their boundaries to look and discussed how to get there.  Clinician assessed for immediate needs, medication compliance and efficacy, and safety concerns.     Suicidal/Homicidal: Nowithout intent/plan  Therapist Response:  Miguel Dibblelexis C Guaman is a 20 y.o. female who presents with depression and anxiety symptoms. Patient was on time and low energy in group today. Patient had difficulty staying awake and responded moderately to redirection. Patient reports no anxiety and depression symptoms and that she felt "a little" sad and happy. Patient continues to be most animated when discussing risky behaviors including past drug use and reckless decision making.  Patient was moderately engaged  in mask activity. Patient reports understanding of boundaries. Patient completed boundary activity.  Patient demonstrates slight progress as evidenced by increased responsiveness to redirection. Patient was reminded multiple times of the need to be alert in group. Patient reports continued mood stability and states that her new medication is effective and she feels "a lot better" since beginning it. Patient discussed progress and upcoming discharge with clinician. Patient states comfort with discharge expected date and step down plan to engage in DBT program, accepted referrals to follow up with. Patient denies SI/HI/self-harm thoughts.     Plan: Patient will continue in PHP and medication management. Work towards decreasing depression and anxiety symptoms and increase emotional regulation and positive coping skills   Diagnosis: Primary Diagnosis: Bipolar 2 disorder, major depressive episode (HCC) [F31.81]    1. Bipolar 2 disorder, major depressive episode (HCC)       Donia GuilesJenny Kellin Fifer, LCSW 05/09/2016

## 2016-05-09 NOTE — Psych (Signed)
   [facility] BH PHP THERAPIST PROGRESS NOTE  [name] [id number]  Session Time: 9 AM - 2 PM  Participation Level: Minimal  Behavioral Response: CasualAlertDepressed  Type of Therapy: Group Therapy  Treatment Goals addressed: Anxiety and Coping, Depression  Interventions: CBT, DBT, Strength-based, Supportive and Reframing  Summary: Clinician facilitated check-in regarding current stressors and situation, and review of patient completed diary card. Clinician utilized active listening and empathetic response and validated patient emotions. Group engaged in guided meditation mindfulness activity and discussed progress.  Clinician led discussion regarding strengths and utilized handout "My Strengths and Qualities." Clinician processed with patients their difficulty with completing the sheet and challenged them to think past their negative feelings to past times where they may have felt more capable or "better."  RN led group session on goals.  Clinician led activity on "what I want my life to look like." Using art, patients identified what they want in the future and discussed how to achieve those things.  Group met with OT therapist for group session.  Clinician assessed for immediate needs, medication compliance and efficacy, and safety concerns.     Suicidal/Homicidal: Nowithout intent/plan  Therapist Response:  Dana Fisher is a 20 y.o. female who presents with depression and anxiety symptoms. Patient was on time and presented with low energy. Patient had difficulty staying alert. Patient reports feeling continued fatigue. Patient reports "mild" anxiety and depression symptoms and that she felt "a little" sad and happy. Patient increased participation as group progressed.  Patient struggled to identify positive traits about herself or what other people like about her. Patient appropriately engaged in activity and reports goals of travel, "being content," having pets, and independence  as future goals.  Patient engaged in group with RN and OT therapist.  Patient demonstrates progress as evidenced by decreased need for redirection. Patient presented with increased periods of brightened affect and increased verbal participation. Patient reports continued mood stability and medication efficacy. Patient reports continued comfort with discharge plan. Patient denies SI/HI/self-harm thoughts.    Plan: Patient will continue in PHP and medication management. Work towards decreasing depression and anxiety symptoms and increase emotional regulation and positive coping skills   Diagnosis: Primary Diagnosis: Bipolar 2 disorder, major depressive episode ([place]) [F31.81]    1. Bipolar 2 disorder, major depressive episode ([place])       Lorin Glass, LCSW 05/09/2016

## 2016-05-10 ENCOUNTER — Encounter (HOSPITAL_COMMUNITY): Payer: Self-pay | Admitting: Licensed Clinical Social Worker

## 2016-05-10 ENCOUNTER — Other Ambulatory Visit (HOSPITAL_COMMUNITY): Payer: 59 | Admitting: Licensed Clinical Social Worker

## 2016-05-10 DIAGNOSIS — F3181 Bipolar II disorder: Secondary | ICD-10-CM | POA: Diagnosis not present

## 2016-05-10 NOTE — Psych (Signed)
   [facility]  Dana Fisher [id number]  Session Time: 9 AM - 1 PM  Participation Level: Minimal  Behavioral Response: CasualAlertDepressed  Type of Therapy: Group Therapy  Treatment Goals addressed: Anxiety and Coping, Depression  Interventions: CBT, DBT, Strength-based, Supportive and Reframing  Summary: Clinician facilitated check-in regarding current stressors and situation, and review of patient completed diary card. Clinician utilized active listening and empathetic response and validated patient emotions. Group engaged in progressive muscle relaxation mindfulness activity and discussed progress.  Clinician introduced topic of trust. Clinician supervised counseling intern Whitney C., in facilitating discussion regarding topic of family relationships. Group completed genogram activity, to consider relationships within their family and had discussion regarding relationships they would like to improve and how, utilizing handout "Relationship Building." Group viewed TED talk "Say your truths and seek them in others" to facilitate conversation about transparency with family relationships.  Group recognized group member's graduation of the program.  Clinician assessed for immediate needs, medication compliance and efficacy, and safety concerns.     Suicidal/Homicidal: Nowithout intent/plan  Therapist Response:  Dana [name] is a 20 y.o. female who presents with depression and anxiety symptoms. Patient was on time and presented with increased energy and alertness. Patient reports no anxiety and depression symptoms and that she felt her mood was stable. Patient states she is "fine."  Patient appropriately engaged in genogram activity and was able to identify relationships in which she wanted to improve and how she could do that. Patient exhibited low insight into her situation and was unable to utilize depth in her answers. Patient was alert and participated in  group discussions.  Patient demonstrates progress as evidenced by stating decrease in anxiety and depressive symptoms and increased mood stability. Patient reports medication efficacy and compliance. Patient reports continued comfort with discharge plan. Patient denies SI/HI/self-harm thoughts.    Plan: Patient will continue in PHP and medication management. Work towards decreasing depression and anxiety symptoms and increase emotional regulation and positive coping skills   Diagnosis: Primary Diagnosis: Bipolar 2 disorder, major depressive episode (HCC) [F31.81]    1. Bipolar 2 disorder, major depressive episode (HCC)       Donia GuilesJenny Gracin [place], LCSW 05/10/2016

## 2016-05-13 ENCOUNTER — Encounter (HOSPITAL_COMMUNITY): Payer: Self-pay | Admitting: Licensed Clinical Social Worker

## 2016-05-13 ENCOUNTER — Other Ambulatory Visit (HOSPITAL_COMMUNITY): Payer: 59 | Admitting: Licensed Clinical Social Worker

## 2016-05-13 ENCOUNTER — Other Ambulatory Visit (HOSPITAL_COMMUNITY): Payer: 59

## 2016-05-13 DIAGNOSIS — F3181 Bipolar II disorder: Secondary | ICD-10-CM
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
## 2016-05-14 ENCOUNTER — Ambulatory Visit (HOSPITAL_COMMUNITY): Payer: Self-pay

## 2016-05-14 ENCOUNTER — Other Ambulatory Visit (HOSPITAL_COMMUNITY): Payer: Self-pay

## 2016-05-15 ENCOUNTER — Other Ambulatory Visit (HOSPITAL_COMMUNITY): Payer: Self-pay

## 2016-05-16 ENCOUNTER — Other Ambulatory Visit (HOSPITAL_COMMUNITY): Payer: Self-pay

## 2016-05-16 ENCOUNTER — Ambulatory Visit (HOSPITAL_COMMUNITY): Payer: Self-pay

## 2016-05-17 ENCOUNTER — Other Ambulatory Visit (HOSPITAL_COMMUNITY): Payer: Self-pay

## 2016-05-20 ENCOUNTER — Ambulatory Visit (HOSPITAL_COMMUNITY): Payer: Self-pay

## 2016-05-20 ENCOUNTER — Other Ambulatory Visit (HOSPITAL_COMMUNITY): Payer: Self-pay

## 2016-05-21 ENCOUNTER — Ambulatory Visit (HOSPITAL_COMMUNITY): Payer: Self-pay

## 2016-05-21 ENCOUNTER — Other Ambulatory Visit (HOSPITAL_COMMUNITY): Payer: Self-pay

## 2016-05-22 ENCOUNTER — Other Ambulatory Visit (HOSPITAL_COMMUNITY): Payer: Self-pay

## 2016-05-23 ENCOUNTER — Other Ambulatory Visit (HOSPITAL_COMMUNITY): Payer: Self-pay

## 2016-05-23 ENCOUNTER — Ambulatory Visit (HOSPITAL_COMMUNITY): Payer: Self-pay

## 2016-05-24 ENCOUNTER — Other Ambulatory Visit (HOSPITAL_COMMUNITY): Payer: Self-pay

## 2016-05-27 ENCOUNTER — Ambulatory Visit (HOSPITAL_COMMUNITY): Payer: Self-pay

## 2016-05-27 ENCOUNTER — Other Ambulatory Visit (HOSPITAL_COMMUNITY): Payer: Self-pay

## 2016-05-28 ENCOUNTER — Other Ambulatory Visit (HOSPITAL_COMMUNITY): Payer: Self-pay

## 2016-05-28 ENCOUNTER — Ambulatory Visit (HOSPITAL_COMMUNITY): Payer: Self-pay

## 2016-05-29 ENCOUNTER — Other Ambulatory Visit (HOSPITAL_COMMUNITY): Payer: Self-pay

## 2016-05-30 ENCOUNTER — Ambulatory Visit (HOSPITAL_COMMUNITY): Payer: Self-pay

## 2016-05-30 ENCOUNTER — Other Ambulatory Visit (HOSPITAL_COMMUNITY): Payer: Self-pay

## 2016-05-31 ENCOUNTER — Other Ambulatory Visit (HOSPITAL_COMMUNITY): Payer: Self-pay

## 2016-06-03 ENCOUNTER — Ambulatory Visit (HOSPITAL_COMMUNITY): Payer: Self-pay

## 2016-06-03 ENCOUNTER — Other Ambulatory Visit (HOSPITAL_COMMUNITY): Payer: Self-pay

## 2016-06-04 ENCOUNTER — Ambulatory Visit (HOSPITAL_COMMUNITY): Payer: Self-pay

## 2016-06-04 ENCOUNTER — Other Ambulatory Visit (HOSPITAL_COMMUNITY): Payer: Self-pay

## 2016-06-05 ENCOUNTER — Other Ambulatory Visit (HOSPITAL_COMMUNITY): Payer: Self-pay

## 2016-06-06 ENCOUNTER — Other Ambulatory Visit (HOSPITAL_COMMUNITY): Payer: Self-pay

## 2016-06-06 ENCOUNTER — Ambulatory Visit (HOSPITAL_COMMUNITY): Payer: Self-pay

## 2016-06-07 ENCOUNTER — Other Ambulatory Visit (HOSPITAL_COMMUNITY): Payer: Self-pay

## 2016-06-10 ENCOUNTER — Other Ambulatory Visit (HOSPITAL_COMMUNITY): Payer: Self-pay

## 2016-06-11 ENCOUNTER — Other Ambulatory Visit (HOSPITAL_COMMUNITY): Payer: Self-pay

## 2016-06-11 ENCOUNTER — Ambulatory Visit (HOSPITAL_COMMUNITY): Payer: Self-pay

## 2016-06-12 ENCOUNTER — Other Ambulatory Visit (HOSPITAL_COMMUNITY): Payer: Self-pay

## 2016-06-18 ENCOUNTER — Ambulatory Visit (HOSPITAL_COMMUNITY): Payer: Self-pay

## 2016-07-22 ENCOUNTER — Encounter: Payer: Self-pay | Admitting: Nurse Practitioner

## 2016-07-22 ENCOUNTER — Ambulatory Visit (INDEPENDENT_AMBULATORY_CARE_PROVIDER_SITE_OTHER): Payer: 59 | Admitting: Nurse Practitioner

## 2016-07-22 VITALS — BP 102/66 | HR 64 | Temp 98.4°F | Ht 61.75 in | Wt 125.0 lb

## 2016-07-22 DIAGNOSIS — R309 Painful micturition, unspecified: Secondary | ICD-10-CM | POA: Diagnosis not present

## 2016-07-22 DIAGNOSIS — R319 Hematuria, unspecified: Secondary | ICD-10-CM | POA: Diagnosis not present

## 2016-07-22 DIAGNOSIS — N760 Acute vaginitis: Secondary | ICD-10-CM

## 2016-07-22 DIAGNOSIS — R350 Frequency of micturition: Secondary | ICD-10-CM | POA: Diagnosis not present

## 2016-07-22 LAB — POCT URINALYSIS DIPSTICK
Bilirubin, UA: NEGATIVE
Glucose, UA: NEGATIVE
Ketones, UA: NEGATIVE
Nitrite, UA: NEGATIVE
Protein, UA: NEGATIVE
Urobilinogen, UA: NEGATIVE
pH, UA: 5.5

## 2016-07-22 LAB — CBC
HCT: 40.8 % (ref 35.0–45.0)
Hemoglobin: 13.7 g/dL (ref 11.7–15.5)
MCH: 28.8 pg (ref 27.0–33.0)
MCHC: 33.6 g/dL (ref 32.0–36.0)
MCV: 85.7 fL (ref 80.0–100.0)
MPV: 9.4 fL (ref 7.5–12.5)
Platelets: 260 10*3/uL (ref 140–400)
RBC: 4.76 MIL/uL (ref 3.80–5.10)
RDW: 13.2 % (ref 11.0–15.0)
WBC: 9.3 10*3/uL (ref 3.8–10.8)

## 2016-07-22 MED ORDER — NITROFURANTOIN MONOHYD MACRO 100 MG PO CAPS: 100.0000 mg | ORAL_CAPSULE | Freq: Two times a day (BID) | ORAL | 0 refills | Status: DC

## 2016-07-22 NOTE — Progress Notes (Signed)
Reviewed personally.  M. Suzanne Aryani Daffern, MD.  

## 2016-07-22 NOTE — Progress Notes (Signed)
20 y.o.Single Caucasian female [id number] here with complaint of UTI with onset  on 07/18/16. Patient complaining of:  dysuria, hematuria, urinary frequency, urinary urgency and abdominal pain. Patient denies fever, chills, nausea or back pain.  She has not felt well in general for several days.     No new personal products. Patient feels maybe to sexual activity.   Vaginal symptoms with discharge that is yellow. She did have bleeding post coital 2 days ago that she says is not her cycle.  She feels bleeding came from the urethra.  She stated last pm she was having a lot of cervical pain that was 'so bad she almost went to the hospital'.    Contraception is OCP - 1 day late taking pill and is concerned about pregnancy but no missed cycle.   Change in partner X 3 months.  Patient has adequate water intake.  LMP 07/09/2016.   O: Healthy female WDWN Affect: Normal, orientation x 3 Skin : warm and dry CVAT: negative bilateral Abdomen: negative for suprapubic tenderness  Pelvic exam: External genital area: normal, no lesions Bladder,Urethra tender, Urethral meatus: normal.  When touching her bladder she says her "cervix is painful" Vagina: yellow vaginal discharge, normal appearance Affirm is done Cervix: normal Uterus:normal,non tender Adnexa: normal non tender, no fullness or masses  POCT:  RBC - moderate, leuk's moderate  A:  R/O UTI  R/O vaginitis  R/O STD's    P: Reviewed findings of UTI and need for treatment. Rx:  Macrobid 100 mg BID # 14 Will also check a serum pregnancy since she has concerns about taking OCP late ZOX:WRUEALab:Urine culture, Affirm; GC & Chl; serum pregnancy, CBC Reviewed warning signs and symptoms of UTI and need to advise if occurring. Encouraged to limit soda, tea, and coffee   RV prn

## 2016-07-22 NOTE — Patient Instructions (Signed)

## 2016-07-23 ENCOUNTER — Telehealth: Payer: Self-pay | Admitting: *Deleted

## 2016-07-23 LAB — WET PREP BY MOLECULAR PROBE
Candida species: NEGATIVE
Gardnerella vaginalis: NEGATIVE
Trichomonas vaginosis: NEGATIVE

## 2016-07-23 LAB — HCG, SERUM, QUALITATIVE: Preg, Serum: NEGATIVE

## 2016-07-23 NOTE — Telephone Encounter (Signed)
Pt notified in result note.  Closing encounter. 

## 2016-07-23 NOTE — Telephone Encounter (Signed)
-----   Message from Ria CommentPatricia Grubb, FNP sent at 07/23/2016  7:53 AM EDT ----- Please let pt know that HCG was negative.  The CBC was normal and the wet prep was negative.  The GC/CHL and urine culture is pending.

## 2016-07-23 NOTE — Telephone Encounter (Signed)
I have attempted to contact this patient by phone with the following results: voicemail has not been set up.  I will continue to try later.  [phone] (Mobile)

## 2016-07-24 LAB — URINE CULTURE: Colony Count: >=100000

## 2016-07-25 LAB — IPS N GONORRHOEA AND CHLAMYDIA BY PCR

## 2016-07-26 NOTE — Progress Notes (Signed)
Attempted to call patient by phone with the following results: voicemail not set up. I will try to contact later. [phone] (Mobile). -sco

## 2016-08-15 NOTE — Telephone Encounter (Signed)
Patient returned call. Patient's GC Chl results were discussed with patient.

## 2016-09-23 ENCOUNTER — Ambulatory Visit: Payer: 59 | Admitting: Nurse Practitioner

## 2016-10-18 ENCOUNTER — Emergency Department (HOSPITAL_COMMUNITY)
Admission: EM | Admit: 2016-10-18 | Discharge: 2016-10-18 | Disposition: A | Discharge status: Home / Self Care | Payer: 59 | Attending: Emergency Medicine | Admitting: Emergency Medicine

## 2016-10-18 ENCOUNTER — Encounter (HOSPITAL_COMMUNITY): Payer: Self-pay

## 2016-10-18 ENCOUNTER — Emergency Department (HOSPITAL_COMMUNITY): Payer: 59

## 2016-10-18 DIAGNOSIS — Z87891 Personal history of nicotine dependence: Secondary | ICD-10-CM | POA: Diagnosis not present

## 2016-10-18 DIAGNOSIS — R55 Syncope and collapse: Secondary | ICD-10-CM | POA: Diagnosis present

## 2016-10-18 DIAGNOSIS — J181 Lobar pneumonia, unspecified organism: Secondary | ICD-10-CM | POA: Diagnosis not present

## 2016-10-18 DIAGNOSIS — R10817 Generalized abdominal tenderness: Secondary | ICD-10-CM | POA: Diagnosis not present

## 2016-10-18 DIAGNOSIS — J189 Pneumonia, unspecified organism: Secondary | ICD-10-CM

## 2016-10-18 LAB — BASIC METABOLIC PANEL
Anion gap: 7 (ref 5–15)
BUN: 7 mg/dL (ref 6–20)
CO2: 25 mmol/L (ref 22–32)
Calcium: 8.3 mg/dL — ABNORMAL LOW (ref 8.9–10.3)
Chloride: 104 mmol/L (ref 101–111)
Creatinine, Ser: 0.85 mg/dL (ref 0.44–1.00)
GFR calc Af Amer: >60 mL/min (ref >60)
GFR calc non Af Amer: >60 mL/min (ref >60)
Glucose, Bld: 68 mg/dL (ref 65–99)
Potassium: 3.8 mmol/L (ref 3.5–5.1)
Sodium: 136 mmol/L (ref 135–145)

## 2016-10-18 LAB — HEPATIC FUNCTION PANEL
ALT: 13 U/L — ABNORMAL LOW (ref 14–54)
AST: 23 U/L (ref 15–41)
Albumin: 3.6 g/dL (ref 3.5–5.0)
Alkaline Phosphatase: 53 U/L (ref 38–126)
Bilirubin, Direct: 0.2 mg/dL (ref 0.1–0.5)
Indirect Bilirubin: 0.1 mg/dL — ABNORMAL LOW (ref 0.3–0.9)
Total Bilirubin: 0.3 mg/dL (ref 0.3–1.2)
Total Protein: 5.9 g/dL — ABNORMAL LOW (ref 6.5–8.1)

## 2016-10-18 LAB — I-STAT TROPONIN, ED: Troponin i, poc: 0 ng/mL (ref 0.00–0.08)

## 2016-10-18 LAB — URINALYSIS, ROUTINE W REFLEX MICROSCOPIC
Bilirubin Urine: NEGATIVE
Glucose, UA: NEGATIVE mg/dL
Hgb urine dipstick: NEGATIVE
Ketones, ur: NEGATIVE mg/dL
Leukocytes, UA: NEGATIVE
Nitrite: NEGATIVE
Protein, ur: NEGATIVE mg/dL
Specific Gravity, Urine: 1.029 (ref 1.005–1.030)
pH: 6.5 (ref 5.0–8.0)

## 2016-10-18 LAB — CBC
HCT: 40.1 % (ref 36.0–46.0)
Hemoglobin: 13.4 g/dL (ref 12.0–15.0)
MCH: 29.3 pg (ref 26.0–34.0)
MCHC: 33.4 g/dL (ref 30.0–36.0)
MCV: 87.6 fL (ref 78.0–100.0)
Platelets: 207 10*3/uL (ref 150–400)
RBC: 4.58 MIL/uL (ref 3.87–5.11)
RDW: 13.1 % (ref 11.5–15.5)
WBC: 9.2 10*3/uL (ref 4.0–10.5)

## 2016-10-18 LAB — I-STAT BETA HCG BLOOD, ED (MC, WL, AP ONLY): I-stat hCG, quantitative: <5 m[IU]/mL (ref <5)

## 2016-10-18 LAB — CBG MONITORING, ED: Glucose-Capillary: 68 mg/dL (ref 65–99)

## 2016-10-18 LAB — LIPASE, BLOOD: Lipase: 24 U/L (ref 11–51)

## 2016-10-18 MED ORDER — AZITHROMYCIN 250 MG PO TABS: 250.0000 mg | ORAL_TABLET | Freq: Every day | ORAL | 0 refills | Status: DC

## 2016-10-18 MED ORDER — SODIUM CHLORIDE 0.9 % IV BOLUS (SEPSIS)
2000.0000 mL | Freq: Once | INTRAVENOUS | Status: AC
  Administered 2016-10-18: 2000 mL via INTRAVENOUS

## 2016-10-18 MED ORDER — IOPAMIDOL (ISOVUE-300) INJECTION 61%
INTRAVENOUS | Status: AC
  Administered 2016-10-18: 100 mL
  Filled 2016-10-18: qty 100

## 2016-10-18 MED ORDER — CEPHALEXIN 500 MG PO CAPS: 500.0000 mg | ORAL_CAPSULE | Freq: Four times a day (QID) | ORAL | 0 refills | Status: DC

## 2016-10-18 NOTE — ED Notes (Signed)
Phlebotomy at bedside.
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
## 2016-10-18 NOTE — Discharge Instructions (Signed)
Take tylenol 2 pills 4 times a day and motrin 4 pills 3 times a day.  Drink plenty of fluids.  Return for worsening shortness of breath, headache, confusion. Follow up with your family doctor.   
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
## 2016-10-18 NOTE — ED Notes (Signed)
ED Provider at bedside. 

## 2016-10-18 NOTE — ED Notes (Signed)
Patient transported to CT 

## 2016-10-28 ENCOUNTER — Encounter: Payer: Self-pay | Admitting: Family Medicine

## 2016-10-28 ENCOUNTER — Ambulatory Visit (INDEPENDENT_AMBULATORY_CARE_PROVIDER_SITE_OTHER): Payer: 59 | Admitting: Family Medicine

## 2016-10-28 VITALS — BP 120/80 | HR 72 | Temp 98.3°F | Resp 16 | Wt 133.2 lb

## 2016-10-28 DIAGNOSIS — J181 Lobar pneumonia, unspecified organism: Secondary | ICD-10-CM | POA: Diagnosis not present

## 2016-10-28 DIAGNOSIS — Z09 Encounter for follow-up examination after completed treatment for conditions other than malignant neoplasm: Secondary | ICD-10-CM

## 2016-10-28 DIAGNOSIS — J189 Pneumonia, unspecified organism: Secondary | ICD-10-CM
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
## 2017-01-07 ENCOUNTER — Encounter: Payer: Self-pay | Admitting: Certified Nurse Midwife

## 2017-01-07 ENCOUNTER — Ambulatory Visit (INDEPENDENT_AMBULATORY_CARE_PROVIDER_SITE_OTHER): Payer: Managed Care, Other (non HMO) | Admitting: Certified Nurse Midwife

## 2017-01-07 VITALS — BP 104/68 | HR 68 | Resp 16 | Ht 61.75 in | Wt 128.0 lb

## 2017-01-07 DIAGNOSIS — Z30011 Encounter for initial prescription of contraceptive pills: Secondary | ICD-10-CM

## 2017-01-07 DIAGNOSIS — Z0000 Encounter for general adult medical examination without abnormal findings: Secondary | ICD-10-CM

## 2017-01-07 DIAGNOSIS — Z01419 Encounter for gynecological examination (general) (routine) without abnormal findings: Secondary | ICD-10-CM

## 2017-01-07 MED ORDER — NORETHIN ACE-ETH ESTRAD-FE 1-20 MG-MCG PO TABS: 1.0000 | ORAL_TABLET | Freq: Every day | ORAL | 4 refills | Status: DC

## 2017-01-07 NOTE — Patient Instructions (Signed)
General topics  Next pap or exam is  due in 1 year Take a Women's multivitamin Take 1200 mg. of calcium daily - prefer dietary If any concerns in interim to call back  Breast Self-Awareness Practicing breast self-awareness may pick up problems early, prevent significant medical complications, and possibly save your life. By practicing breast self-awareness, you can become familiar with how your breasts look and feel and if your breasts are changing. This allows you to notice changes early. It can also offer you some reassurance that your breast health is good. One way to learn what is normal for your breasts and whether your breasts are changing is to do a breast self-exam. If you find a lump or something that was not present in the past, it is best to contact your caregiver right away. Other findings that should be evaluated by your caregiver include nipple discharge, especially if it is bloody; skin changes or reddening; areas where the skin seems to be pulled in (retracted); or new lumps and bumps. Breast pain is seldom associated with cancer (malignancy), but should also be evaluated by a caregiver. BREAST SELF-EXAM The best time to examine your breasts is 5 7 days after your menstrual period is over.  ExitCare Patient Information 2013 [employer].   Exercise to Stay Healthy Exercise helps you become and stay healthy. EXERCISE IDEAS AND TIPS Choose exercises that:  You enjoy.  Fit into your day. You do not need to exercise really hard to be healthy. You can do exercises at a slow or medium level and stay healthy. You can:  Stretch before and after working out.  Try yoga, Pilates, or tai chi.  Lift weights.  Walk fast, swim, jog, run, climb stairs, bicycle, dance, or rollerskate.  Take aerobic classes. Exercises that burn about 150 calories:  Running 1  miles in 15 minutes.  Playing volleyball for 45 to 60 minutes.  Washing and waxing a car for 45 to 60  minutes.  Playing touch football for 45 minutes.  Walking 1  miles in 35 minutes.  Pushing a stroller 1  miles in 30 minutes.  Playing basketball for 30 minutes.  Raking leaves for 30 minutes.  Bicycling 5 miles in 30 minutes.  Walking 2 miles in 30 minutes.  Dancing for 30 minutes.  Shoveling snow for 15 minutes.  Swimming laps for 20 minutes.  Walking up stairs for 15 minutes.  Bicycling 4 miles in 15 minutes.  Gardening for 30 to 45 minutes.  Jumping rope for 15 minutes.  Washing windows or floors for 45 to 60 minutes. Document Released: 12/28/2010 Document Revised: 02/17/2012 Document Reviewed: 12/28/2010 ExitCare Patient Information 2013 [employer].   Other topics ( that may be useful information):    Sexually Transmitted Disease Sexually transmitted disease (STD) refers to any infection that is passed from person to person during sexual activity. This may happen by way of saliva, semen, blood, vaginal mucus, or urine. Common STDs include:  Gonorrhea.  Chlamydia.  Syphilis.  HIV/AIDS.  Genital herpes.  Hepatitis B and C.  Trichomonas.  Human papillomavirus (HPV).  Pubic lice. CAUSES  An STD may be spread by bacteria, virus, or parasite. A person can get an STD by:  Sexual intercourse with an infected person.  Sharing sex toys with an infected person.  Sharing needles with an infected person.  Having intimate contact with the genitals, mouth, or rectal areas of an infected person. SYMPTOMS  Some people may not have any symptoms, but   they can still pass the infection to others. Different STDs have different symptoms. Symptoms include:  Painful or bloody urination.  Pain in the pelvis, abdomen, vagina, anus, throat, or eyes.  Skin rash, itching, irritation, growths, or sores (lesions). These usually occur in the genital or anal area.  Abnormal vaginal discharge.  Penile discharge in men.  Soft, flesh-colored skin growths in the  genital or anal area.  Fever.  Pain or bleeding during sexual intercourse.  Swollen glands in the groin area.  Yellow skin and eyes (jaundice). This is seen with hepatitis. DIAGNOSIS  To make a diagnosis, your caregiver may:  Take a medical history.  Perform a physical exam.  Take a specimen (culture) to be examined.  Examine a sample of discharge under a microscope.  Perform blood test TREATMENT   Chlamydia, gonorrhea, trichomonas, and syphilis can be cured with antibiotic medicine.  Genital herpes, hepatitis, and HIV can be treated, but not cured, with prescribed medicines. The medicines will lessen the symptoms.  Genital warts from HPV can be treated with medicine or by freezing, burning (electrocautery), or surgery. Warts may come back.  HPV is a virus and cannot be cured with medicine or surgery.However, abnormal areas may be followed very closely by your caregiver and may be removed from the cervix, vagina, or vulva through office procedures or surgery. If your diagnosis is confirmed, your recent sexual partners need treatment. This is true even if they are symptom-free or have a negative culture or evaluation. They should not have sex until their caregiver says it is okay. HOME CARE INSTRUCTIONS  All sexual partners should be informed, tested, and treated for all STDs.  Take your antibiotics as directed. Finish them even if you start to feel better.  Only take over-the-counter or prescription medicines for pain, discomfort, or fever as directed by your caregiver.  Rest.  Eat a balanced diet and drink enough fluids to keep your urine clear or pale yellow.  Do not have sex until treatment is completed and you have followed up with your caregiver. STDs should be checked after treatment.  Keep all follow-up appointments, Pap tests, and blood tests as directed by your caregiver.  Only use latex condoms and water-soluble lubricants during sexual activity. Do not use  petroleum jelly or oils.  Avoid alcohol and illegal drugs.  Get vaccinated for HPV and hepatitis. If you have not received these vaccines in the past, talk to your caregiver about whether one or both might be right for you.  Avoid risky sex practices that can break the skin. The only way to avoid getting an STD is to avoid all sexual activity.Latex condoms and dental dams (for oral sex) will help lessen the risk of getting an STD, but will not completely eliminate the risk. SEEK MEDICAL CARE IF:   You have a fever.  You have any new or worsening symptoms. Document Released: 02/15/2003 Document Revised: 02/17/2012 Document Reviewed: 02/22/2011 [facility] Patient Information 2013 [place].    Domestic Abuse You are being battered or abused if someone close to you hits, pushes, or physically hurts you in any way. You also are being abused if you are forced into activities. You are being sexually abused if you are forced to have sexual contact of any kind. You are being emotionally abused if you are made to feel worthless or if you are constantly threatened. It is important to remember that help is available. No one has the right to abuse you. PREVENTION OF FURTHER  ABUSE  Learn the warning signs of danger. This varies with situations but may include: the use of alcohol, threats, isolation from friends and family, or forced sexual contact. Leave if you feel that violence is going to occur.  If you are attacked or beaten, report it to the police so the abuse is documented. You do not have to press charges. The police can protect you while you or the attackers are leaving. Get the officer's name and badge number and a copy of the report.  Find someone you can trust and tell them what is happening to you: your caregiver, a nurse, clergy member, close friend or family member. Feeling ashamed is natural, but remember that you have done nothing wrong. No one deserves abuse. Document Released:  11/22/2000 Document Revised: 02/17/2012 Document Reviewed: 01/31/2011 ExitCare Patient Information 2013 [employer].    How Much is Too Much Alcohol? Drinking too much alcohol can cause injury, accidents, and health problems. These types of problems can include:   Car crashes.  Falls.  Family fighting (domestic violence).  Drowning.  Fights.  Injuries.  Burns.  Damage to certain organs.  Having a baby with birth defects. ONE DRINK CAN BE TOO MUCH WHEN YOU ARE:  Working.  Pregnant or breastfeeding.  Taking medicines. Ask your doctor.  Driving or planning to drive. If you or someone you know has a drinking problem, get help from a doctor.  Document Released: 09/21/2009 Document Revised: 02/17/2012 Document Reviewed: 09/21/2009 ExitCare Patient Information 2013 [employer].   Smoking Hazards Smoking cigarettes is extremely bad for your health. Tobacco smoke has over 200 known poisons in it. There are over 60 chemicals in tobacco smoke that cause cancer. Some of the chemicals found in cigarette smoke include:   Cyanide.  Benzene.  Formaldehyde.  Methanol (wood alcohol).  Acetylene (fuel used in welding torches).  Ammonia. Cigarette smoke also contains the poisonous gases nitrogen oxide and carbon monoxide.  Cigarette smokers have an increased risk of many serious medical problems and Smoking causes approximately:  90% of all lung cancer deaths in men.  80% of all lung cancer deaths in women.  90% of deaths from chronic obstructive lung disease. Compared with nonsmokers, smoking increases the risk of:  Coronary heart disease by 2 to 4 times.  Stroke by 2 to 4 times.  Men developing lung cancer by 23 times.  Women developing lung cancer by 13 times.  Dying from chronic obstructive lung diseases by 12 times.  . Smoking is the most preventable cause of death and disease in our society.  WHY IS SMOKING ADDICTIVE?  Nicotine is the chemical  agent in tobacco that is capable of causing addiction or dependence.  When you smoke and inhale, nicotine is absorbed rapidly into the bloodstream through your lungs. Nicotine absorbed through the lungs is capable of creating a powerful addiction. Both inhaled and non-inhaled nicotine may be addictive.  Addiction studies of cigarettes and spit tobacco show that addiction to nicotine occurs mainly during the teen years, when young people begin using tobacco products. WHAT ARE THE BENEFITS OF QUITTING?  There are many health benefits to quitting smoking.   Likelihood of developing cancer and heart disease decreases. Health improvements are seen almost immediately.  Blood pressure, pulse rate, and breathing patterns start returning to normal soon after quitting. QUITTING SMOKING   American Lung Association - 1-800-LUNGUSA  American Cancer Society - 1-800-ACS-2345 Document Released: 01/02/2005 Document Revised: 02/17/2012 Document Reviewed: 09/06/2009 ExitCare Patient Information 2013 ExitCare[employer]   LL[employer].   Stress Management Stress is a state of physical or mental tension that often results from changes in your life or normal routine. Some common causes of stress are:  Death of a loved one.  Injuries or severe illnesses.  Getting fired or changing jobs.  Moving into a new home. Other causes may be:  Sexual problems.  Business or financial losses.  Taking on a large debt.  Regular conflict with someone at home or at work.  Constant tiredness from lack of sleep. It is not just bad things that are stressful. It may be stressful to:  Win the lottery.  Get married.  Buy a new car. The amount of stress that can be easily tolerated varies from person to person. Changes generally cause stress, regardless of the types of change. Too much stress can affect your health. It may lead to physical or emotional problems. Too little stress (boredom) may also become stressful. SUGGESTIONS TO  REDUCE STRESS:  Talk things over with your family and friends. It often is helpful to share your concerns and worries. If you feel your problem is serious, you may want to get help from a professional counselor.  Consider your problems one at a time instead of lumping them all together. Trying to take care of everything at once may seem impossible. List all the things you need to do and then start with the most important one. Set a goal to accomplish 2 or 3 things each day. If you expect to do too many in a single day you will naturally fail, causing you to feel even more stressed.  Do not use alcohol or drugs to relieve stress. Although you may feel better for a short time, they do not remove the problems that caused the stress. They can also be habit forming.  Exercise regularly - at least 3 times per week. Physical exercise can help to relieve that "uptight" feeling and will relax you.  The shortest distance between despair and hope is often a good night's sleep.  Go to bed and get up on time allowing yourself time for appointments without being rushed.  Take a short "time-out" period from any stressful situation that occurs during the day. Close your eyes and take some deep breaths. Starting with the muscles in your face, tense them, hold it for a few seconds, then relax. Repeat this with the muscles in your neck, shoulders, hand, stomach, back and legs.  Take good care of yourself. Eat a balanced diet and get plenty of rest.  Schedule time for having fun. Take a break from your daily routine to relax. HOME CARE INSTRUCTIONS   Call if you feel overwhelmed by your problems and feel you can no longer manage them on your own.  Return immediately if you feel like hurting yourself or someone else. Document Released: 05/21/2001 Document Revised: 02/17/2012 Document Reviewed: 01/11/2008 [facility] Patient Information 2013 [place].
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
## 2017-01-08 LAB — HIV ANTIBODY (ROUTINE TESTING W REFLEX): HIV 1&2 Ab, 4th Generation: NONREACTIVE

## 2017-01-08 LAB — GC/CHLAMYDIA PROBE AMP
CT Probe RNA: NOT DETECTED
GC Probe RNA: NOT DETECTED

## 2017-01-08 LAB — HEPATITIS C ANTIBODY: HCV Ab: NEGATIVE

## 2017-01-08 LAB — WET PREP BY MOLECULAR PROBE
Candida species: NEGATIVE
Gardnerella vaginalis: NEGATIVE
Trichomonas vaginosis: NEGATIVE

## 2017-01-08 LAB — RPR

## 2017-01-08 NOTE — Progress Notes (Signed)
Encounter reviewed Chet Greenley, MD   

## 2017-03-02 ENCOUNTER — Other Ambulatory Visit: Payer: Self-pay | Admitting: Certified Nurse Midwife

## 2017-03-02 DIAGNOSIS — Z30011 Encounter for initial prescription of contraceptive pills: Secondary | ICD-10-CM
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
## 2017-09-20 ENCOUNTER — Encounter (HOSPITAL_COMMUNITY): Payer: Self-pay | Admitting: Emergency Medicine

## 2017-09-20 ENCOUNTER — Emergency Department (HOSPITAL_COMMUNITY)
Admission: EM | Admit: 2017-09-20 | Discharge: 2017-09-21 | Disposition: A | Discharge status: Home / Self Care | Payer: 59 | Attending: Emergency Medicine | Admitting: Emergency Medicine

## 2017-09-20 DIAGNOSIS — R450 Nervousness: Secondary | ICD-10-CM | POA: Diagnosis not present

## 2017-09-20 DIAGNOSIS — Z630 Problems in relationship with spouse or partner: Secondary | ICD-10-CM | POA: Insufficient documentation

## 2017-09-20 DIAGNOSIS — F3181 Bipolar II disorder: Secondary | ICD-10-CM | POA: Diagnosis present

## 2017-09-20 DIAGNOSIS — Z8659 Personal history of other mental and behavioral disorders: Secondary | ICD-10-CM | POA: Diagnosis not present

## 2017-09-20 DIAGNOSIS — Z046 Encounter for general psychiatric examination, requested by authority: Secondary | ICD-10-CM | POA: Diagnosis not present

## 2017-09-20 DIAGNOSIS — Z79899 Other long term (current) drug therapy: Secondary | ICD-10-CM | POA: Insufficient documentation

## 2017-09-20 DIAGNOSIS — F332 Major depressive disorder, recurrent severe without psychotic features: Secondary | ICD-10-CM | POA: Diagnosis not present

## 2017-09-20 DIAGNOSIS — F17200 Nicotine dependence, unspecified, uncomplicated: Secondary | ICD-10-CM | POA: Diagnosis not present

## 2017-09-20 DIAGNOSIS — F329 Major depressive disorder, single episode, unspecified: Secondary | ICD-10-CM | POA: Insufficient documentation

## 2017-09-20 DIAGNOSIS — R45851 Suicidal ideations: Secondary | ICD-10-CM | POA: Insufficient documentation

## 2017-09-20 DIAGNOSIS — F419 Anxiety disorder, unspecified: Secondary | ICD-10-CM | POA: Diagnosis not present

## 2017-09-20 DIAGNOSIS — F411 Generalized anxiety disorder: Secondary | ICD-10-CM | POA: Diagnosis present

## 2017-09-20 LAB — RAPID URINE DRUG SCREEN, HOSP PERFORMED
Amphetamines: NOT DETECTED
Barbiturates: NOT DETECTED
Benzodiazepines: POSITIVE — AB
Cocaine: NOT DETECTED
Opiates: NOT DETECTED
Tetrahydrocannabinol: NOT DETECTED

## 2017-09-20 LAB — COMPREHENSIVE METABOLIC PANEL
ALT: 13 U/L — ABNORMAL LOW (ref 14–54)
AST: 19 U/L (ref 15–41)
Albumin: 4.6 g/dL (ref 3.5–5.0)
Alkaline Phosphatase: 22 U/L — ABNORMAL LOW (ref 38–126)
Anion gap: 21 — ABNORMAL HIGH (ref 5–15)
BUN: 10 mg/dL (ref 6–20)
CO2: 15 mmol/L — ABNORMAL LOW (ref 22–32)
Calcium: <4 mg/dL — CL (ref 8.9–10.3)
Chloride: 103 mmol/L (ref 101–111)
Creatinine, Ser: 0.59 mg/dL (ref 0.44–1.00)
GFR calc Af Amer: >60 mL/min (ref >60)
GFR calc non Af Amer: >60 mL/min (ref >60)
Glucose, Bld: 95 mg/dL (ref 65–99)
Potassium: >7.5 mmol/L (ref 3.5–5.1)
Sodium: 139 mmol/L (ref 135–145)
Total Bilirubin: 0.3 mg/dL (ref 0.3–1.2)
Total Protein: 7.6 g/dL (ref 6.5–8.1)

## 2017-09-20 LAB — I-STAT CHEM 8, ED
BUN: 8 mg/dL (ref 6–20)
Calcium, Ion: 1.09 mmol/L — ABNORMAL LOW (ref 1.15–1.40)
Chloride: 106 mmol/L (ref 101–111)
Creatinine, Ser: 0.7 mg/dL (ref 0.44–1.00)
Glucose, Bld: 90 mg/dL (ref 65–99)
HCT: 41 % (ref 36.0–46.0)
Hemoglobin: 13.9 g/dL (ref 12.0–15.0)
Potassium: 3.8 mmol/L (ref 3.5–5.1)
Sodium: 142 mmol/L (ref 135–145)
TCO2: 25 mmol/L (ref 22–32)

## 2017-09-20 LAB — I-STAT BETA HCG BLOOD, ED (MC, WL, AP ONLY): I-stat hCG, quantitative: <5 m[IU]/mL (ref <5)

## 2017-09-20 LAB — CBC
HCT: 43.2 % (ref 36.0–46.0)
Hemoglobin: 14.9 g/dL (ref 12.0–15.0)
MCH: 30.2 pg (ref 26.0–34.0)
MCHC: 34.5 g/dL (ref 30.0–36.0)
MCV: 87.6 fL (ref 78.0–100.0)
Platelets: 317 10*3/uL (ref 150–400)
RBC: 4.93 MIL/uL (ref 3.87–5.11)
RDW: 12.5 % (ref 11.5–15.5)
WBC: 10.4 10*3/uL (ref 4.0–10.5)

## 2017-09-20 LAB — SALICYLATE LEVEL: Salicylate Lvl: <7 mg/dL (ref 2.8–30.0)

## 2017-09-20 LAB — ACETAMINOPHEN LEVEL: Acetaminophen (Tylenol), Serum: <10 ug/mL — ABNORMAL LOW (ref 10–30)

## 2017-09-20 LAB — ETHANOL: Alcohol, Ethyl (B): 89 mg/dL — ABNORMAL HIGH (ref <10)

## 2017-09-20 MED ORDER — ALUM & MAG HYDROXIDE-SIMETH 200-200-20 MG/5ML PO SUSP
30.0000 mL | Freq: Four times a day (QID) | ORAL | Status: DC | PRN
Start: 2017-09-20 — End: 2017-09-21

## 2017-09-20 MED ORDER — ZOLPIDEM TARTRATE 5 MG PO TABS: 5.0000 mg | ORAL_TABLET | Freq: Every evening | ORAL | Status: DC | PRN

## 2017-09-20 MED ORDER — ALBUTEROL SULFATE HFA 108 (90 BASE) MCG/ACT IN AERS: 2.0000 | INHALATION_SPRAY | RESPIRATORY_TRACT | Status: DC | PRN

## 2017-09-20 MED ORDER — ARIPIPRAZOLE 5 MG PO TABS
5.0000 mg | ORAL_TABLET | Freq: Every day | ORAL | Status: DC
  Administered 2017-09-21: 5 mg via ORAL
  Filled 2017-09-20: qty 1

## 2017-09-20 MED ORDER — ACETAMINOPHEN 325 MG PO TABS
650.0000 mg | ORAL_TABLET | ORAL | Status: DC | PRN
  Administered 2017-09-21: 650 mg via ORAL
  Filled 2017-09-20: qty 2

## 2017-09-20 MED ORDER — MONTELUKAST SODIUM 10 MG PO TABS
10.0000 mg | ORAL_TABLET | Freq: Every day | ORAL | Status: DC
  Administered 2017-09-21: 10 mg via ORAL
  Filled 2017-09-20: qty 1
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
## 2017-09-20 NOTE — ED Triage Notes (Signed)
Pt brought in by police under IVC  Pt has hx of bipolar and small things trigger her  Pt was threatening to jump off a bridge tonight but then changed her mind because she did not want to involve others in her mess  Someone that saw her on the street called police that IVC'd her and brought her here

## 2017-09-21 DIAGNOSIS — F332 Major depressive disorder, recurrent severe without psychotic features: Secondary | ICD-10-CM | POA: Diagnosis not present

## 2017-09-21 DIAGNOSIS — Z818 Family history of other mental and behavioral disorders: Secondary | ICD-10-CM

## 2017-09-21 DIAGNOSIS — Z813 Family history of other psychoactive substance abuse and dependence: Secondary | ICD-10-CM

## 2017-09-21 DIAGNOSIS — F419 Anxiety disorder, unspecified: Secondary | ICD-10-CM | POA: Diagnosis not present

## 2017-09-21 DIAGNOSIS — Z630 Problems in relationship with spouse or partner: Secondary | ICD-10-CM | POA: Diagnosis not present

## 2017-09-21 DIAGNOSIS — R450 Nervousness: Secondary | ICD-10-CM

## 2017-09-21 LAB — POTASSIUM: Potassium: 3.8 mmol/L (ref 3.5–5.1)

## 2017-09-21 MED ORDER — HYDROXYZINE PAMOATE 25 MG PO CAPS: 25.0000 mg | ORAL_CAPSULE | Freq: Three times a day (TID) | ORAL | 1 refills | Status: DC | PRN

## 2017-09-21 MED ORDER — ARIPIPRAZOLE 5 MG PO TABS: 7.5000 mg | ORAL_TABLET | Freq: Every day | ORAL | 0 refills | Status: DC

## 2017-09-21 NOTE — ED Notes (Signed)
Hourly rounding reveals patient sleeping in room. No complaints, stable, in no acute distress. Q15 minute rounds and monitoring via Security Cameras to continue. 
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
## 2017-09-21 NOTE — ED Notes (Signed)
Pt discharged safely after reviewing discharge instructions and RX.  Pt verbalized understanding.  Pt denies S/I and felt safe leaving.  All belongings were sent with patient.

## 2017-09-21 NOTE — ED Notes (Signed)
Hourly rounding reveals patient in room talking to TTS. No complaints, stable, in no acute distress. Q15 minute rounds and monitoring via Security Cameras to continue. 

## 2017-09-21 NOTE — ED Notes (Signed)
Pt. Transferred to [facility] from ED to room 37 after screening for contraband. Report to include Situation, Background, Assessment and Recommendations from RN. Pt. Oriented to unit including Q15 minute rounds as well as the security cameras for their protection. Patient is alert and oriented, warm and dry in no acute distress. Patient denies SI, HI, and AVH. Pt. Encouraged to let me know if needs arise. 

## 2017-09-21 NOTE — BHH Suicide Risk Assessment (Signed)
[facility] Discharge Suicide Risk Assessment   Principal Problem: Acute anxiety state, concern for suicidal ideation  Discharge Diagnoses:  Patient Active Problem List   Diagnosis Date Noted  . Bipolar 2 disorder, major depressive episode (HCC) [F31.81] 05/02/2016    Class: Chronic  . Generalized anxiety disorder [F41.1] 12/09/2015  . Benzodiazepine overdose [T42.4X1A] 12/08/2015  . Overdose [T50.901A] 12/08/2015  . Suicidal intent [R45.851] 12/08/2015  . Hypotension [I95.9] 12/08/2015  . Acute encephalopathy [G93.40] 12/08/2015  . Drug overdose [T50.901A]   . Headache migraine type [R51] 11/23/2013   Total Time spent with patient: 30 minutes   Patient reports that she was not intending to harm herself. She was going for a walk after feeling very sensitive to her boyfriend being upset with her.  BF is currently visiting and they have reconciled. She often goes for walks when she is upset, because this is part of her coping. She was standing near the bridge but reports she was just standing there and crying.   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 She denies any recent SI or self harm, denies any plan to harm herself. She lives with her mom who is a good support. She is stressed/worried about the financial costs of this admission as they tend to struggle with money.  Reports weekend alcohol intake, and periodic THC. No daily substance use.  BF reports that he is not concerned she would harm herself. She tends to stay with him and with mom sometimes. BF denies any recent threats of SI.    eviewed safety strategies and coping. Patient is able to name multiple coping mechanisms for anxiety or SI if she was to have unsafe thoughts. Including music, going for walks, jogging, calling mom, calling BF, doing artwork, taking a nap  Musculoskeletal: Strength & Muscle Tone: within normal limits Gait & Station: normal Patient leans:  N/A  Psychiatric Specialty Exam: Review of Systems  Constitutional: Negative.   Eyes: Negative.   Respiratory: Negative.   Cardiovascular: Negative.   Gastrointestinal: Negative.   Musculoskeletal: Negative.   Neurological: Negative.   Psychiatric/Behavioral: Positive for depression. Negative for hallucinations, memory loss, substance abuse and suicidal ideas. The patient is nervous/anxious. The patient does not have insomnia.     Blood pressure 116/76, pulse (!) 102, temperature 98.4 F (36.9 C), temperature source Oral, resp. rate 18, height 5' 1.75" (1.568 m), weight 50.8 kg (112 lb), last menstrual period 09/07/2017, SpO2 100 %.Body mass index is 20.65 kg/m.  General Appearance: Casual and Fairly Groomed  Patent attorney::  Fair  Speech:  Clear and Coherent409  Volume:  Normal  Mood:  Anxious and Irritable  Affect:  Congruent  Thought Process:  Goal Directed and Descriptions of Associations: Intact  Orientation:  Full (Time, Place, and Person)  Thought Content:  Logical  Suicidal Thoughts:  No  Homicidal Thoughts:  No  Memory:  Immediate;   Good  Judgement:  Fair  Insight:  Fair  Psychomotor Activity:  Normal  Concentration:  Fair  Recall:  Fair  Fund of Knowledge:Fair  Language: Fair  Akathisia:  Negative  Handed:  Right  AIMS (if indicated):     Assets:  Communication Skills Desire for Improvement Housing Intimacy Physical Health Social Support Transportation Vocational/Educational  Sleep:     Cognition: WNL  ADL's:  Intact   Mental Status Per Nursing Assessment::   On Admission:     Demographic Factors:  Adolescent  or young adult, Caucasian and Low socioeconomic status  Loss Factors: NA  Historical Factors: Prior suicide attempts, Family history of mental illness or substance abuse and Impulsivity  Risk Reduction Factors:   Sense of responsibility to family, Employed, Living with another person, especially a relative, Positive social support and  Positive coping skills or problem solving skills  Continued Clinical Symptoms:  More than one psychiatric diagnosis  Cognitive Features That Contribute To Risk:  None    Suicide Risk:  Mild:  Suicidal ideation of limited frequency, intensity, duration, and specificity.  There are no identifiable plans, no associated intent, mild dysphoria and related symptoms, good self-control (both objective and subjective assessment), few other risk factors, and identifiable protective factors, including available and accessible social support.  Plan Of Care/Follow-up recommendations:  [facility] and currently takes Abilify 5 mg daily.  She denies any acute safety issues and does not fulfill IVC criteria.  She lives with family and reports a good support system. BF is at bedside and reports firearms are locked away without patient access.    We agreed to increase to 7.5 mg daily for further mood control and use vistaril 25 mg Q8H prn for agitation/anxiety.  She agrees to contact her outpatient provider to schedule follow-up in the next 2-4 weeks.  Will DC ivc and discharge home.   Burnard Leigh, MD 09/21/2017, 11:00 AM

## 2017-09-30 ENCOUNTER — Encounter: Payer: Self-pay | Admitting: Certified Nurse Midwife

## 2017-09-30 ENCOUNTER — Ambulatory Visit: Payer: 59 | Admitting: Certified Nurse Midwife

## 2017-09-30 VITALS — BP 102/62 | HR 80 | Resp 16 | Ht 61.75 in | Wt 116.0 lb

## 2017-09-30 DIAGNOSIS — Z113 Encounter for screening for infections with a predominantly sexual mode of transmission: Secondary | ICD-10-CM

## 2017-09-30 DIAGNOSIS — S3141XA Laceration without foreign body of vagina and vulva, initial encounter: Secondary | ICD-10-CM

## 2017-09-30 DIAGNOSIS — R300 Dysuria: Secondary | ICD-10-CM

## 2017-09-30 NOTE — Patient Instructions (Signed)
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
## 2017-10-01 LAB — VAGINITIS/VAGINOSIS, DNA PROBE
Candida Species: NEGATIVE
Gardnerella vaginalis: NEGATIVE
Trichomonas vaginosis: NEGATIVE

## 2017-10-01 LAB — URINE CULTURE

## 2017-10-01 LAB — GC/CHLAMYDIA PROBE AMP
Chlamydia trachomatis, NAA: POSITIVE — AB
Neisseria gonorrhoeae by PCR: NEGATIVE

## 2017-10-02 ENCOUNTER — Telehealth: Payer: Self-pay

## 2017-10-02 MED ORDER — AZITHROMYCIN 1 G PO PACK: 1.0000 | PACK | Freq: Once | ORAL | 0 refills | Status: AC

## 2017-10-02 NOTE — Telephone Encounter (Signed)
Spoke with patient. Results given. Aware she will need to abstain from intercourse until she and her partner have received treatment and for 1 week following. Will need to use condoms for protection against STD's with intercourse. Patient verbalizes understanding. Zithromax 1 gram take 1 packet once #1 0RF sent to pharmacy on file. Health department confidential communicable disease report completed and [facility] with [facility] at [phone]. 3 month TOC set for 01/08/2018 at her aex with Leota Sauerseborah Leonard CNM.  Routing to provider for final review. Patient agreeable to disposition. Will close encounter.

## 2017-10-02 NOTE — Telephone Encounter (Signed)
Attempted to reach patient at number provided [phone], recording states that the voiceamail box has not been set up yet.

## 2017-10-02 NOTE — Telephone Encounter (Signed)
-----   Message from Verner Choleborah S Leonard, CNM sent at 10/01/2017  8:10 PM EDT ----- Notify patient that her Gc was negative Chlamydia is positive will need Rx Azithromycin  and TOC in 3 months Vaginal screen negative for yeast, Bv and trichomonas Urine culture negative for infection, mixed flora noted

## 2017-10-07 ENCOUNTER — Telehealth: Payer: Self-pay | Admitting: Certified Nurse Midwife

## 2017-10-07 NOTE — Telephone Encounter (Signed)
Spoke with patient. Patient is asking if she had HSV testing performed when she was in office on 09/30/2017. Advised this was not done. Patient states she has vaginal bumps that are in a cluster. Worried because her previous partner was with someone who had genital herpes. Advised will need to be seen for evaluation. Offered appointment for tomorrow, but patient declines. Appointment scheduled for 10/09/2017 at 11 am with Leota Sauerseborah Leonard CNM.  Routing to provider for final review. Patient agreeable to disposition. Will close encounter.

## 2017-10-07 NOTE — Telephone Encounter (Signed)
Patient called with questions for the nurse about her recent results.

## 2017-10-08 DIAGNOSIS — J302 Other seasonal allergic rhinitis: Secondary | ICD-10-CM | POA: Insufficient documentation

## 2017-10-08 DIAGNOSIS — J45901 Unspecified asthma with (acute) exacerbation: Secondary | ICD-10-CM | POA: Insufficient documentation

## 2017-10-09 ENCOUNTER — Encounter: Payer: Self-pay | Admitting: Certified Nurse Midwife

## 2017-10-09 ENCOUNTER — Ambulatory Visit (INDEPENDENT_AMBULATORY_CARE_PROVIDER_SITE_OTHER): Payer: 59 | Admitting: Certified Nurse Midwife

## 2017-10-09 VITALS — BP 110/60 | HR 64 | Resp 16 | Ht 61.75 in | Wt 115.0 lb

## 2017-10-09 DIAGNOSIS — Z113 Encounter for screening for infections with a predominantly sexual mode of transmission: Secondary | ICD-10-CM | POA: Diagnosis not present

## 2017-10-09 NOTE — Progress Notes (Signed)
Subjective:     Patient ID: Dana Fisher, female   DOB: 10-02-1996, 21 y.o.   MRN: [id number]  21 yo single female here complaining of bumps in vaginal area. Noted the bumps sporadically for the past 3 months. ? shaving bumps. Some increase discharge since treatment for Chlamydia. Has follow up scheduled. Not sexually now. So afraid she may have contracted herpes. No painful bumps or vagina itching or change in discharge. No partner change .     Review of Systems  Constitutional: Negative.   Gastrointestinal: Negative.   Genitourinary: Negative for vaginal bleeding, vaginal discharge and vaginal pain.       Has noted bumps in vaginal area  Skin: Negative.   Neurological: Negative.   Psychiatric/Behavioral: The patient is nervous/anxious.        History of suicidal attempts        Objective:   Physical Exam  Constitutional: She is oriented to person, place, and time. She appears well-developed and well-nourished.  Genitourinary:    There is no rash, tenderness, lesion or injury on the right labia. There is no rash, tenderness, lesion or injury on the left labia.  Lymphadenopathy:       Right: No inguinal adenopathy present.       Left: No inguinal adenopathy present.  Neurological: She is alert and oriented to person, place, and time.  Skin: Skin is warm and dry.  Psychiatric: She has a normal mood and affect. Her behavior is normal. Judgment and thought content normal.       Assessment:     Normal external genital exam, declined pelvic exam Shaving bump type irritation only seen per diagram Recent positive chlamydia with treatment completed    Plan:     Discussed finding with patient and this will occur after shaving at times or with increase perspiration. Discussed appearance of genital warts and with herpes. Questions addressed. Would like to have serum HSV 1,2 drawn. Reassured that no other findings seen. [facility]( [facility]  [facility]) Lab HSV 1,2  Rv prn

## 2017-10-10 LAB — HSV(HERPES SIMPLEX VRS) I + II AB-IGG
HSV 1 Glycoprotein G Ab, IgG: <0.91 index (ref 0.00–0.90)
HSV 2 IgG, Type Spec: <0.91 index (ref 0.00–0.90)

## 2017-12-12 ENCOUNTER — Encounter: Payer: Self-pay | Admitting: Family Medicine

## 2017-12-12 ENCOUNTER — Ambulatory Visit: Payer: 59 | Admitting: Family Medicine

## 2017-12-12 VITALS — BP 106/72 | HR 85 | Temp 98.3°F | Wt 109.8 lb

## 2017-12-12 DIAGNOSIS — R52 Pain, unspecified: Secondary | ICD-10-CM | POA: Diagnosis not present

## 2017-12-12 DIAGNOSIS — J029 Acute pharyngitis, unspecified: Secondary | ICD-10-CM | POA: Diagnosis not present

## 2017-12-12 LAB — POCT RAPID STREP A (OFFICE): Rapid Strep A Screen: NEGATIVE

## 2017-12-12 NOTE — Patient Instructions (Signed)
Your strep test is negative. Do salt water gargles and take ibuprofen 600 mg for pain.   If you are not improving in the next 2-3 days then start the antibiotic.

## 2017-12-12 NOTE — Progress Notes (Signed)
Chief Complaint  Patient presents with  . other    sore throat two days ago. tired, nausea fever, headache with neck pain     Subjective:  Dana Fisher is a 22 y.o. female who presents for a 2 day history of fever, chills, body aches, headache, sore throat, nausea, mild cough. States throat is very painful to swallow and cough.   States she went to urgent care yesterday and was negative for flu. They prescribed her Amoxicillin in case she does not improve in the next few days. Patient states she was not aware that they prescribed this. States she was not tested for strep.   Denies ear pain, dizziness, rash, chest pain, palpitations, shortness of breath, wheezing, abdominal pain, vomiting, diarrhea.   Treatment to date: ibuprofen .  Denies sick contacts.  No other aggravating or relieving factors.  No other c/o.  ROS as in subjective.   Objective: Vitals:   12/12/17 1442  BP: 106/72  Pulse: 85  Temp: 98.3 F (36.8 C)  SpO2: 98%    General appearance: Alert, WD/WN, no distress, mildly ill appearing                             Skin: warm, no rash                           Head: no sinus tenderness                            Eyes: conjunctiva normal, corneas clear, PERRLA                            Ears: pearly TMs, external ear canals normal                          Nose: septum midline, turbinates swollen, with erythema and clear discharge             Mouth/throat: MMM, tongue normal, mild pharyngeal erythema without edema.                            Neck: supple, no adenopathy, no thyromegaly, nontender                          Heart: RRR, normal S1, S2, no murmurs                         Lungs: CTA bilaterally, no wheezes, rales, or rhonchi      Assessment: Acute pharyngitis, unspecified etiology - Plan: POCT rapid strep A  Body aches  Sore throat   Plan: Discussed diagnosis and treatment of acute pharyngitis with a negative strep test. She has a prescription for  Amoxicillin that was prescribed by the urgent care she visited yesterday.   Suggested symptomatic OTC remedies. Stay well hydrated. Salt water gargles.   Tylenol or Ibuprofen OTC for fever and malaise.  She may start the antibiotic if she worsens or if not feeling better in the next 1-2 days. She verbalized understanding.

## 2018-01-08 ENCOUNTER — Ambulatory Visit: Payer: 59 | Admitting: Certified Nurse Midwife

## 2018-01-08 ENCOUNTER — Other Ambulatory Visit: Payer: Self-pay

## 2018-01-08 ENCOUNTER — Encounter: Payer: Self-pay | Admitting: Certified Nurse Midwife

## 2018-01-08 VITALS — BP 110/70 | HR 64 | Resp 16 | Ht 61.25 in | Wt 109.0 lb

## 2018-01-08 DIAGNOSIS — F17210 Nicotine dependence, cigarettes, uncomplicated: Secondary | ICD-10-CM | POA: Diagnosis not present

## 2018-01-08 DIAGNOSIS — Z01419 Encounter for gynecological examination (general) (routine) without abnormal findings: Secondary | ICD-10-CM

## 2018-01-08 DIAGNOSIS — Z8659 Personal history of other mental and behavioral disorders: Secondary | ICD-10-CM

## 2018-01-08 NOTE — Patient Instructions (Signed)
General topics  Next pap or exam is  due in 1 year Take a Women's multivitamin Take 1200 mg. of calcium daily - prefer dietary If any concerns in interim to call back  Breast Self-Awareness Practicing breast self-awareness may pick up problems early, prevent significant medical complications, and possibly save your life. By practicing breast self-awareness, you can become familiar with how your breasts look and feel and if your breasts are changing. This allows you to notice changes early. It can also offer you some reassurance that your breast health is good. One way to learn what is normal for your breasts and whether your breasts are changing is to do a breast self-exam. If you find a lump or something that was not present in the past, it is best to contact your caregiver right away. Other findings that should be evaluated by your caregiver include nipple discharge, especially if it is bloody; skin changes or reddening; areas where the skin seems to be pulled in (retracted); or new lumps and bumps. Breast pain is seldom associated with cancer (malignancy), but should also be evaluated by a caregiver. BREAST SELF-EXAM The best time to examine your breasts is 5 7 days after your menstrual period is over.  ExitCare Patient Information 2013 [employer].   Exercise to Stay Healthy Exercise helps you become and stay healthy. EXERCISE IDEAS AND TIPS Choose exercises that:  You enjoy.  Fit into your day. You do not need to exercise really hard to be healthy. You can do exercises at a slow or medium level and stay healthy. You can:  Stretch before and after working out.  Try yoga, Pilates, or tai chi.  Lift weights.  Walk fast, swim, jog, run, climb stairs, bicycle, dance, or rollerskate.  Take aerobic classes. Exercises that burn about 150 calories:  Running 1  miles in 15 minutes.  Playing volleyball for 45 to 60 minutes.  Washing and waxing a car for 45 to 60  minutes.  Playing touch football for 45 minutes.  Walking 1  miles in 35 minutes.  Pushing a stroller 1  miles in 30 minutes.  Playing basketball for 30 minutes.  Raking leaves for 30 minutes.  Bicycling 5 miles in 30 minutes.  Walking 2 miles in 30 minutes.  Dancing for 30 minutes.  Shoveling snow for 15 minutes.  Swimming laps for 20 minutes.  Walking up stairs for 15 minutes.  Bicycling 4 miles in 15 minutes.  Gardening for 30 to 45 minutes.  Jumping rope for 15 minutes.  Washing windows or floors for 45 to 60 minutes. Document Released: 12/28/2010 Document Revised: 02/17/2012 Document Reviewed: 12/28/2010 ExitCare Patient Information 2013 [employer].   Other topics ( that may be useful information):    Sexually Transmitted Disease Sexually transmitted disease (STD) refers to any infection that is passed from person to person during sexual activity. This may happen by way of saliva, semen, blood, vaginal mucus, or urine. Common STDs include:  Gonorrhea.  Chlamydia.  Syphilis.  HIV/AIDS.  Genital herpes.  Hepatitis B and C.  Trichomonas.  Human papillomavirus (HPV).  Pubic lice. CAUSES  An STD may be spread by bacteria, virus, or parasite. A person can get an STD by:  Sexual intercourse with an infected person.  Sharing sex toys with an infected person.  Sharing needles with an infected person.  Having intimate contact with the genitals, mouth, or rectal areas of an infected person. SYMPTOMS  Some people may not have any symptoms, but   they can still pass the infection to others. Different STDs have different symptoms. Symptoms include:  Painful or bloody urination.  Pain in the pelvis, abdomen, vagina, anus, throat, or eyes.  Skin rash, itching, irritation, growths, or sores (lesions). These usually occur in the genital or anal area.  Abnormal vaginal discharge.  Penile discharge in men.  Soft, flesh-colored skin growths in the  genital or anal area.  Fever.  Pain or bleeding during sexual intercourse.  Swollen glands in the groin area.  Yellow skin and eyes (jaundice). This is seen with hepatitis. DIAGNOSIS  To make a diagnosis, your caregiver may:  Take a medical history.  Perform a physical exam.  Take a specimen (culture) to be examined.  Examine a sample of discharge under a microscope.  Perform blood test TREATMENT   Chlamydia, gonorrhea, trichomonas, and syphilis can be cured with antibiotic medicine.  Genital herpes, hepatitis, and HIV can be treated, but not cured, with prescribed medicines. The medicines will lessen the symptoms.  Genital warts from HPV can be treated with medicine or by freezing, burning (electrocautery), or surgery. Warts may come back.  HPV is a virus and cannot be cured with medicine or surgery.However, abnormal areas may be followed very closely by your caregiver and may be removed from the cervix, vagina, or vulva through office procedures or surgery. If your diagnosis is confirmed, your recent sexual partners need treatment. This is true even if they are symptom-free or have a negative culture or evaluation. They should not have sex until their caregiver says it is okay. HOME CARE INSTRUCTIONS  All sexual partners should be informed, tested, and treated for all STDs.  Take your antibiotics as directed. Finish them even if you start to feel better.  Only take over-the-counter or prescription medicines for pain, discomfort, or fever as directed by your caregiver.  Rest.  Eat a balanced diet and drink enough fluids to keep your urine clear or pale yellow.  Do not have sex until treatment is completed and you have followed up with your caregiver. STDs should be checked after treatment.  Keep all follow-up appointments, Pap tests, and blood tests as directed by your caregiver.  Only use latex condoms and water-soluble lubricants during sexual activity. Do not use  petroleum jelly or oils.  Avoid alcohol and illegal drugs.  Get vaccinated for HPV and hepatitis. If you have not received these vaccines in the past, talk to your caregiver about whether one or both might be right for you.  Avoid risky sex practices that can break the skin. The only way to avoid getting an STD is to avoid all sexual activity.Latex condoms and dental dams (for oral sex) will help lessen the risk of getting an STD, but will not completely eliminate the risk. SEEK MEDICAL CARE IF:   You have a fever.  You have any new or worsening symptoms. Document Released: 02/15/2003 Document Revised: 02/17/2012 Document Reviewed: 02/22/2011 [facility] Patient Information 2013 [employer].    Domestic Abuse You are being battered or abused if someone close to you hits, pushes, or physically hurts you in any way. You also are being abused if you are forced into activities. You are being sexually abused if you are forced to have sexual contact of any kind. You are being emotionally abused if you are made to feel worthless or if you are constantly threatened. It is important to remember that help is available. No one has the right to abuse you. PREVENTION OF FURTHER  ABUSE  Learn the warning signs of danger. This varies with situations but may include: the use of alcohol, threats, isolation from friends and family, or forced sexual contact. Leave if you feel that violence is going to occur.  If you are attacked or beaten, report it to the police so the abuse is documented. You do not have to press charges. The police can protect you while you or the attackers are leaving. Get the officer's name and badge number and a copy of the report.  Find someone you can trust and tell them what is happening to you: your caregiver, a nurse, clergy member, close friend or family member. Feeling ashamed is natural, but remember that you have done nothing wrong. No one deserves abuse. Document Released:  11/22/2000 Document Revised: 02/17/2012 Document Reviewed: 01/31/2011 ExitCare Patient Information 2013 [employer].    How Much is Too Much Alcohol? Drinking too much alcohol can cause injury, accidents, and health problems. These types of problems can include:   Car crashes.  Falls.  Family fighting (domestic violence).  Drowning.  Fights.  Injuries.  Burns.  Damage to certain organs.  Having a baby with birth defects. ONE DRINK CAN BE TOO MUCH WHEN YOU ARE:  Working.  Pregnant or breastfeeding.  Taking medicines. Ask your doctor.  Driving or planning to drive. If you or someone you know has a drinking problem, get help from a doctor.  Document Released: 09/21/2009 Document Revised: 02/17/2012 Document Reviewed: 09/21/2009 ExitCare Patient Information 2013 [employer].   Smoking Hazards Smoking cigarettes is extremely bad for your health. Tobacco smoke has over 200 known poisons in it. There are over 60 chemicals in tobacco smoke that cause cancer. Some of the chemicals found in cigarette smoke include:   Cyanide.  Benzene.  Formaldehyde.  Methanol (wood alcohol).  Acetylene (fuel used in welding torches).  Ammonia. Cigarette smoke also contains the poisonous gases nitrogen oxide and carbon monoxide.  Cigarette smokers have an increased risk of many serious medical problems and Smoking causes approximately:  90% of all lung cancer deaths in men.  80% of all lung cancer deaths in women.  90% of deaths from chronic obstructive lung disease. Compared with nonsmokers, smoking increases the risk of:  Coronary heart disease by 2 to 4 times.  Stroke by 2 to 4 times.  Men developing lung cancer by 23 times.  Women developing lung cancer by 13 times.  Dying from chronic obstructive lung diseases by 12 times.  . Smoking is the most preventable cause of death and disease in our society.  WHY IS SMOKING ADDICTIVE?  Nicotine is the chemical  agent in tobacco that is capable of causing addiction or dependence.  When you smoke and inhale, nicotine is absorbed rapidly into the bloodstream through your lungs. Nicotine absorbed through the lungs is capable of creating a powerful addiction. Both inhaled and non-inhaled nicotine may be addictive.  Addiction studies of cigarettes and spit tobacco show that addiction to nicotine occurs mainly during the teen years, when young people begin using tobacco products. WHAT ARE THE BENEFITS OF QUITTING?  There are many health benefits to quitting smoking.   Likelihood of developing cancer and heart disease decreases. Health improvements are seen almost immediately.  Blood pressure, pulse rate, and breathing patterns start returning to normal soon after quitting. QUITTING SMOKING   American Lung Association - 1-800-LUNGUSA  American Cancer Society - 1-800-ACS-2345 Document Released: 01/02/2005 Document Revised: 02/17/2012 Document Reviewed: 09/06/2009 ExitCare Patient Information 2013 ExitCare[employer]   LL[employer].   Stress Management Stress is a state of physical or mental tension that often results from changes in your life or normal routine. Some common causes of stress are:  Death of a loved one.  Injuries or severe illnesses.  Getting fired or changing jobs.  Moving into a new home. Other causes may be:  Sexual problems.  Business or financial losses.  Taking on a large debt.  Regular conflict with someone at home or at work.  Constant tiredness from lack of sleep. It is not just bad things that are stressful. It may be stressful to:  Win the lottery.  Get married.  Buy a new car. The amount of stress that can be easily tolerated varies from person to person. Changes generally cause stress, regardless of the types of change. Too much stress can affect your health. It may lead to physical or emotional problems. Too little stress (boredom) may also become stressful. SUGGESTIONS TO  REDUCE STRESS:  Talk things over with your family and friends. It often is helpful to share your concerns and worries. If you feel your problem is serious, you may want to get help from a professional counselor.  Consider your problems one at a time instead of lumping them all together. Trying to take care of everything at once may seem impossible. List all the things you need to do and then start with the most important one. Set a goal to accomplish 2 or 3 things each day. If you expect to do too many in a single day you will naturally fail, causing you to feel even more stressed.  Do not use alcohol or drugs to relieve stress. Although you may feel better for a short time, they do not remove the problems that caused the stress. They can also be habit forming.  Exercise regularly - at least 3 times per week. Physical exercise can help to relieve that "uptight" feeling and will relax you.  The shortest distance between despair and hope is often a good night's sleep.  Go to bed and get up on time allowing yourself time for appointments without being rushed.  Take a short "time-out" period from any stressful situation that occurs during the day. Close your eyes and take some deep breaths. Starting with the muscles in your face, tense them, hold it for a few seconds, then relax. Repeat this with the muscles in your neck, shoulders, hand, stomach, back and legs.  Take good care of yourself. Eat a balanced diet and get plenty of rest.  Schedule time for having fun. Take a break from your daily routine to relax. HOME CARE INSTRUCTIONS   Call if you feel overwhelmed by your problems and feel you can no longer manage them on your own.  Return immediately if you feel like hurting yourself or someone else. Document Released: 05/21/2001 Document Revised: 02/17/2012 Document Reviewed: 01/11/2008 ExitCare Patient Information 2013 [employer].   
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
## 2018-04-17 ENCOUNTER — Other Ambulatory Visit: Payer: Self-pay

## 2018-04-17 ENCOUNTER — Encounter: Payer: Self-pay | Admitting: Certified Nurse Midwife

## 2018-04-17 ENCOUNTER — Ambulatory Visit: Payer: 59 | Admitting: Certified Nurse Midwife

## 2018-04-17 VITALS — BP 98/62 | HR 64 | Temp 98.0°F | Resp 16 | Ht 61.25 in | Wt 109.0 lb

## 2018-04-17 DIAGNOSIS — R350 Frequency of micturition: Secondary | ICD-10-CM

## 2018-04-17 DIAGNOSIS — N390 Urinary tract infection, site not specified: Secondary | ICD-10-CM | POA: Diagnosis not present

## 2018-04-17 DIAGNOSIS — B373 Candidiasis of vulva and vagina: Secondary | ICD-10-CM | POA: Diagnosis not present

## 2018-04-17 DIAGNOSIS — B3731 Acute candidiasis of vulva and vagina: Secondary | ICD-10-CM

## 2018-04-17 LAB — POCT URINALYSIS DIPSTICK
Bilirubin, UA: NEGATIVE
Blood, UA: NEGATIVE
Glucose, UA: NEGATIVE
Ketones, UA: NEGATIVE
Leukocytes, UA: NEGATIVE
Nitrite, UA: NEGATIVE
Protein, UA: NEGATIVE
Urobilinogen, UA: NEGATIVE E.U./dL — AB
pH, UA: 5 (ref 5.0–8.0)

## 2018-04-17 MED ORDER — FLUCONAZOLE 150 MG PO TABS: ORAL_TABLET | ORAL | 0 refills | Status: DC

## 2018-04-17 NOTE — Patient Instructions (Signed)
Urinary Frequency, Adult Urinary frequency means urinating more often than usual. People with urinary frequency urinate at least 8 times in 24 hours, even if they drink a normal amount of fluid. Although they urinate more often than normal, the total amount of urine produced in a day may be normal. Urinary frequency is also called pollakiuria. What are the causes? This condition may be caused by:  A urinary tract infection.  Obesity.  Bladder problems, such as bladder stones.  Caffeine or alcohol.  Eating food or drinking fluids that irritate the bladder. These include coffee, tea, soda, artificial sweeteners, citrus, tomato-based foods, and chocolate.  Certain medicines, such as medicines that help the body get rid of extra fluid (diuretics).  Muscle or nerve weakness.  Overactive bladder.  Chronic diabetes.  Interstitial cystitis.  In men, problems with the prostate, such as an enlarged prostate.  In women, pregnancy.  In some cases, the cause may not be known. What increases the risk? This condition is more likely to develop in:  Women who have gone through menopause.  Men with prostate problems.  People with a disease or injury that affects the nerves or spinal cord.  People who have or have had a condition that affects the brain, such as a stroke.  What are the signs or symptoms? Symptoms of this condition include:  Feeling an urgent need to urinate often. The stress and anxiety of needing to find a bathroom quickly can make this urge worse.  Urinating 8 or more times in 24 hours.  Urinating as often as every 1 to 2 hours.  How is this diagnosed? This condition is diagnosed based on your symptoms, your medical history, and a physical exam. You may have tests, such as:  Blood tests.  Urine tests.  Imaging tests, such as X-rays or ultrasounds.  A bladder test.  A test of your neurological system. This is the body system that senses the need to  urinate.  A test to check for problems in the urethra and bladder called cystoscopy.  You may also be asked to keep a bladder diary. A bladder diary is a record of what you eat and drink, how often you urinate, and how much you urinate. You may need to see a health care provider who specializes in conditions of the urinary tract (urologist) or kidneys (nephrologist). How is this treated? Treatment for this condition depends on the cause. Sometimes the condition goes away on its own and treatment is not necessary. If treatment is needed, it may include:  Taking medicine.  Learning exercises that strengthen the muscles that help control urination.  Following a bladder training program. This may include: ? Learning to delay going to the bathroom. ? Double urinating (voiding). This helps if you are not completely emptying your bladder. ? Scheduled voiding.  Making diet changes, such as: ? Avoiding caffeine. ? Drinking fewer fluids, especially alcohol. ? Not drinking in the evening. ? Not having foods or drinks that may irritate the bladder. ? Eating foods that help prevent or ease constipation. Constipation can make this condition worse.  Having the nerves in your bladder stimulated. There are two options for stimulating the nerves to your bladder: ? Outpatient electrical nerve stimulation. This is done by your health care provider. ? Surgery to implant a bladder pacemaker. The pacemaker helps to control the urge to urinate.  Follow these instructions at home:  Keep a bladder diary if told to by your health care provider.  Take over-the-counter   and prescription medicines only as told by your health care provider.  Do any exercises as told by your health care provider.  Follow a bladder training program as told by your health care provider.  Make any recommended diet changes.  Keep all follow-up visits as told by your health care provider. This is important. Contact a health care  provider if:  You start urinating more often.  You feel pain or irritation when you urinate.  You notice blood in your urine.  Your urine looks cloudy.  You develop a fever.  You begin vomiting. Get help right away if:  You are unable to urinate. This information is not intended to replace advice given to you by your health care provider. Make sure you discuss any questions you have with your health care provider. Document Released: 09/21/2009 Document Revised: 12/27/2015 Document Reviewed: 06/21/2015 Elsevier Interactive Patient Education  2018 [employer]. Vaginal Yeast infection, Adult Vaginal yeast infection is a condition that causes soreness, swelling, and redness (inflammation) of the vagina. It also causes vaginal discharge. This is a common condition. Some women get this infection frequently. What are the causes? This condition is caused by a change in the normal balance of the yeast (candida) and bacteria that live in the vagina. This change causes an overgrowth of yeast, which causes the inflammation. What increases the risk? This condition is more likely to develop in:  Women who take antibiotic medicines.  Women who have diabetes.  Women who take birth control pills.  Women who are pregnant.  Women who douche often.  Women who have a weak defense (immune) system.  Women who have been taking steroid medicines for a long time.  Women who frequently wear tight clothing.  What are the signs or symptoms? Symptoms of this condition include:  White, thick vaginal discharge.  Swelling, itching, redness, and irritation of the vagina. The lips of the vagina (vulva) may be affected as well.  Pain or a burning feeling while urinating.  Pain during sex.  How is this diagnosed? This condition is diagnosed with a medical history and physical exam. This will include a pelvic exam. Your health care provider will examine a sample of your vaginal discharge under a  microscope. Your health care provider may send this sample for testing to confirm the diagnosis. How is this treated? This condition is treated with medicine. Medicines may be over-the-counter or prescription. You may be told to use one or more of the following:  Medicine that is taken orally.  Medicine that is applied as a cream.  Medicine that is inserted directly into the vagina (suppository).  Follow these instructions at home:  Take or apply over-the-counter and prescription medicines only as told by your health care provider.  Do not have sex until your health care provider has approved. Tell your sex partner that you have a yeast infection. That person should go to his or her health care provider if he or she develops symptoms.  Do not wear tight clothes, such as pantyhose or tight pants.  Avoid using tampons until your health care provider approves.  Eat more yogurt. This may help to keep your yeast infection from returning.  Try taking a sitz bath to help with discomfort. This is a warm water bath that is taken while you are sitting down. The water should only come up to your hips and should cover your buttocks. Do this 3-4 times per day or as told by your health care provider.  Do not douche.  Wear breathable, cotton underwear.  If you have diabetes, keep your blood sugar levels under control. Contact a health care provider if:  You have a fever.  Your symptoms go away and then return.  Your symptoms do not get better with treatment.  Your symptoms get worse.  You have new symptoms.  You develop blisters in or around your vagina.  You have blood coming from your vagina and it is not your menstrual period.  You develop pain in your abdomen. This information is not intended to replace advice given to you by your health care provider. Make sure you discuss any questions you have with your health care provider. Document Released: 09/04/2005 Document Revised:  05/08/2016 Document Reviewed: 05/29/2015 Elsevier Interactive Patient Education  2018 [employer].

## 2018-04-17 NOTE — Progress Notes (Deleted)
22 y.o. Single Caucasian female G0P0000 here with complaint of vaginal symptoms of itching, burning, and increase discharge after period and treated Monistat 7 OTC with some relief. Feels some better, but not resolved. Has discomfort with urination, but only in bladder area. Some urgency with urination and need "to figure this out" Has been going for at least a month. Describes discharge as white,no odor. No new personal products..  Contraception consistent condoms and no issues with use. Denies new personal products. No  STD concerns.   Review of Systems  Constitutional: Negative for chills, fever and malaise/fatigue.  Gastrointestinal: Negative for abdominal pain, nausea and vomiting.  Genitourinary: Positive for frequency. Negative for flank pain and hematuria.  Psychiatric/Behavioral: The patient is not nervous/anxious.     O:Healthy female WDWN Affect: normal, orientation x 3  Exam: Skin : warm and dry Abdomen: Soft, non tender, slight suprapubic  Inguinal Lymph nodes: no enlargement or tenderness Pelvic exam: External genital: normal female, redness noted at clitoral hood area, no lesions,scaling present slight tenderness BUS: negative, Bladder non tender, urethral meatus non tender Vagina: white odorous discharge noted. Ph:4.0   ,Wet prep taken, Affirm taken Cervix: normal, non tender, no CMT Uterus: normal, non tender Adnexa:normal, non tender, no masses or fullness noted   Wet Prep results: KOH, Saline + yeast, occasional WBC poct urine-neg  A:Normal pelvic exam Yeast vaginitis and yeast vulvitis R/O UTI R/O BV   P:Discussed findings of yeast vaginitis/vulvitis and etiology. Discussed Aveeno or baking soda sitz bath for comfort. Avoid moist clothes  for extended period of time. If working out in gym clothes of time change underwear if possible. Questions addressed Rx Diflucan see order Lab: Affirm, Urine culture Discussed starting on cranberry tablets for urinary health  once daily. Discussed yeast can also cause frequency and oral medication will treat if present in bladder. Will advise if infection once labs back. Encouraged to continue good water intake. Warning signs of UTI given.  Rv prn 

## 2018-04-18 LAB — VAGINITIS/VAGINOSIS, DNA PROBE
Candida Species: NEGATIVE
Gardnerella vaginalis: NEGATIVE
Trichomonas vaginosis: NEGATIVE

## 2018-04-18 LAB — URINE CULTURE

## 2018-04-19 NOTE — Progress Notes (Signed)
22 y.o. Single Caucasian female G0P0000 here with complaint of vaginal symptoms of itching, burning, and increase discharge after period and treated Monistat 7 OTC with some relief. Feels some better, but not resolved. Has discomfort with urination, but only in bladder area. Some urgency with urination and need "to figure this out" Has been going for at least a month. Describes discharge as white,no odor. No new personal products..  Contraception consistent condoms and no issues with use. Denies new personal products. No  STD concerns.   Review of Systems  Constitutional: Negative for chills, fever and malaise/fatigue.  Gastrointestinal: Negative for abdominal pain, nausea and vomiting.  Genitourinary: Positive for frequency. Negative for flank pain and hematuria.  Psychiatric/Behavioral: The patient is not nervous/anxious.     O:Healthy female WDWN Affect: normal, orientation x 3  Exam: Skin : warm and dry Abdomen: Soft, non tender, slight suprapubic  Inguinal Lymph nodes: no enlargement or tenderness Pelvic exam: External genital: normal female, redness noted at clitoral hood area, no lesions,scaling present slight tenderness BUS: negative, Bladder non tender, urethral meatus non tender Vagina: white odorous discharge noted. Ph:4.0   ,Wet prep taken, Affirm taken Cervix: normal, non tender, no CMT Uterus: normal, non tender Adnexa:normal, non tender, no masses or fullness noted   Wet Prep results: KOH, Saline + yeast, occasional WBC poct urine-neg  A:Normal pelvic exam Yeast vaginitis and yeast vulvitis R/O UTI R/O BV   P:Discussed findings of yeast vaginitis/vulvitis and etiology. Discussed Aveeno or baking soda sitz bath for comfort. Avoid moist clothes  for extended period of time. If working out in gym clothes of time change underwear if possible. Questions addressed Rx Diflucan see order Lab: Affirm, Urine culture Discussed starting on cranberry tablets for urinary health  once daily. Discussed yeast can also cause frequency and oral medication will treat if present in bladder. Will advise if infection once labs back. Encouraged to continue good water intake. Warning signs of UTI given.  Rv prn

## 2018-05-27 ENCOUNTER — Encounter: Payer: Self-pay | Admitting: Obstetrics and Gynecology

## 2018-05-27 ENCOUNTER — Ambulatory Visit: Payer: 59 | Admitting: Obstetrics and Gynecology

## 2018-05-27 ENCOUNTER — Other Ambulatory Visit: Payer: Self-pay

## 2018-05-27 VITALS — BP 102/60 | HR 64 | Resp 14 | Wt 110.0 lb

## 2018-05-27 DIAGNOSIS — G43109 Migraine with aura, not intractable, without status migrainosus: Secondary | ICD-10-CM | POA: Diagnosis not present

## 2018-05-27 DIAGNOSIS — N926 Irregular menstruation, unspecified: Secondary | ICD-10-CM

## 2018-05-27 DIAGNOSIS — N761 Subacute and chronic vaginitis: Secondary | ICD-10-CM | POA: Diagnosis not present

## 2018-05-27 DIAGNOSIS — Z113 Encounter for screening for infections with a predominantly sexual mode of transmission: Secondary | ICD-10-CM | POA: Diagnosis not present

## 2018-05-27 LAB — POCT URINE PREGNANCY: Preg Test, Ur: NEGATIVE

## 2018-05-27 MED ORDER — NORETHINDRONE 0.35 MG PO TABS: 1.0000 | ORAL_TABLET | Freq: Every day | ORAL | 2 refills | Status: DC

## 2018-05-27 NOTE — Patient Instructions (Signed)
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
## 2018-05-28 LAB — VAGINITIS/VAGINOSIS, DNA PROBE
Candida Species: NEGATIVE
Gardnerella vaginalis: NEGATIVE
Trichomonas vaginosis: NEGATIVE

## 2018-05-29 ENCOUNTER — Telehealth: Payer: Self-pay

## 2018-05-29 LAB — GC/CHLAMYDIA PROBE AMP
Chlamydia trachomatis, NAA: NEGATIVE
Neisseria gonorrhoeae by PCR: NEGATIVE

## 2018-05-29 NOTE — Telephone Encounter (Addendum)
Attempted to reach patient at number provided. There was no answer and voicemail box has not been set up.  Notes recorded by Romualdo BolkJertson, Jill Evelyn, MD on 05/29/2018 at 11:55 AM EDT Please advise the patient of normal results.  ------  Notes recorded by Romualdo BolkJertson, Jill Evelyn, MD on 05/28/2018 at 5:24 PM EDT Please advise the patient of normal results. genprobe pending

## 2018-05-29 NOTE — Telephone Encounter (Signed)
Spoke with patient, advised of all results as seen below per Dr. Oscar LaJertson. Patient verbalizes understanding and is agreeable. Encounter closed.

## 2018-05-29 NOTE — Telephone Encounter (Signed)
Patient returning Kaitlyn's call for test results.

## 2018-12-25 ENCOUNTER — Ambulatory Visit: Payer: 59 | Admitting: Certified Nurse Midwife

## 2018-12-25 ENCOUNTER — Encounter: Payer: Self-pay | Admitting: Certified Nurse Midwife

## 2018-12-25 ENCOUNTER — Telehealth: Payer: Self-pay | Admitting: Certified Nurse Midwife

## 2018-12-25 ENCOUNTER — Other Ambulatory Visit: Payer: Self-pay

## 2018-12-25 VITALS — BP 90/64 | HR 64 | Resp 16 | Wt 115.0 lb

## 2018-12-25 DIAGNOSIS — N898 Other specified noninflammatory disorders of vagina: Secondary | ICD-10-CM | POA: Diagnosis not present

## 2018-12-25 DIAGNOSIS — B373 Candidiasis of vulva and vagina: Secondary | ICD-10-CM | POA: Diagnosis not present

## 2018-12-25 DIAGNOSIS — N760 Acute vaginitis: Secondary | ICD-10-CM

## 2018-12-25 DIAGNOSIS — B9689 Other specified bacterial agents as the cause of diseases classified elsewhere: Secondary | ICD-10-CM

## 2018-12-25 DIAGNOSIS — B3731 Acute candidiasis of vulva and vagina: Secondary | ICD-10-CM

## 2018-12-25 MED ORDER — FLUCONAZOLE 150 MG PO TABS: ORAL_TABLET | ORAL | 0 refills | Status: DC

## 2018-12-25 MED ORDER — METRONIDAZOLE 500 MG PO TABS: 500.0000 mg | ORAL_TABLET | Freq: Two times a day (BID) | ORAL | 0 refills | Status: DC

## 2018-12-25 NOTE — Telephone Encounter (Signed)
Spoke with patient. Patient reports clear to yellow vaginal d/c, vaginal odor and itching. Patient states symptoms have been present for 2 months, have become unbearable. Denies pelvic pain, irregular bleeding or urinary symptoms. Patient requesting RX for BV.   Advised patient she would need OV for further evaluation, OV scheduled for today at 3:30pm with Leota Sauers, CNM.   Patient verbalizes understanding and is agreeable. Encounter closed.

## 2018-12-25 NOTE — Patient Instructions (Signed)
Vaginal Yeast infection, Adult  Vaginal yeast infection is a condition that causes vaginal discharge as well as soreness, swelling, and redness (inflammation) of the vagina. This is a common condition. Some women get this infection frequently. What are the causes? This condition is caused by a change in the normal balance of the yeast (candida) and bacteria that live in the vagina. This change causes an overgrowth of yeast, which causes the inflammation. What increases the risk? The condition is more likely to develop in women who:  Take antibiotic medicines.  Have diabetes.  Take birth control pills.  Are pregnant.  Douche often.  Have a weak body defense system (immune system).  Have been taking steroid medicines for a long time.  Frequently wear tight clothing. What are the signs or symptoms? Symptoms of this condition include:  White, thick, creamy vaginal discharge.  Swelling, itching, redness, and irritation of the vagina. The lips of the vagina (vulva) may be affected as well.  Pain or a burning feeling while urinating.  Pain during sex. How is this diagnosed? This condition is diagnosed based on:  Your medical history.  A physical exam.  A pelvic exam. Your health care provider will examine a sample of your vaginal discharge under a microscope. Your health care provider may send this sample for testing to confirm the diagnosis. How is this treated? This condition is treated with medicine. Medicines may be over-the-counter or prescription. You may be told to use one or more of the following:  Medicine that is taken by mouth (orally).  Medicine that is applied as a cream (topically).  Medicine that is inserted directly into the vagina (suppository). Follow these instructions at home:  Lifestyle  Do not have sex until your health care provider approves. Tell your sex partner that you have a yeast infection. That person should go to his or her health care  provider and ask if they should also be treated.  Do not wear tight clothes, such as pantyhose or tight pants.  Wear breathable cotton underwear. General instructions  Take or apply over-the-counter and prescription medicines only as told by your health care provider.  Eat more yogurt. This may help to keep your yeast infection from returning.  Do not use tampons until your health care provider approves.  Try taking a sitz bath to help with discomfort. This is a warm water bath that is taken while you are sitting down. The water should only come up to your hips and should cover your buttocks. Do this 3-4 times per day or as told by your health care provider.  Do not douche.  If you have diabetes, keep your blood sugar levels under control.  Keep all follow-up visits as told by your health care provider. This is important. Contact a health care provider if:  You have a fever.  Your symptoms go away and then return.  Your symptoms do not get better with treatment.  Your symptoms get worse.  You have new symptoms.  You develop blisters in or around your vagina.  You have blood coming from your vagina and it is not your menstrual period.  You develop pain in your abdomen. Summary  Vaginal yeast infection is a condition that causes discharge as well as soreness, swelling, and redness (inflammation) of the vagina.  This condition is treated with medicine. Medicines may be over-the-counter or prescription.  Take or apply over-the-counter and prescription medicines only as told by your health care provider.  Do not douche.   Do not have sex or use tampons until your health care provider approves.  Contact a health care provider if your symptoms do not get better with treatment or your symptoms go away and then return. This information is not intended to replace advice given to you by your health care provider. Make sure you discuss any questions you have with your health care  provider. Document Released: 09/04/2005 Document Revised: 04/13/2018 Document Reviewed: 04/13/2018 Elsevier Interactive Patient Education  2019 [employer]  Bacterial Vaginosis  Bacterial vaginosis is an infection of the vagina. It happens when too many normal germs (healthy bacteria) grow in the vagina. This infection puts you at risk for infections from sex (STIs). Treating this infection can lower your risk for some STIs. You should also treat this if you are pregnant. It can cause your baby to be born early. Follow these instructions at home: Medicines  Take over-the-counter and prescription medicines only as told by your doctor.  Take or use your antibiotic medicine as told by your doctor. Do not stop taking or using it even if you start to feel better. General instructions  If you your sexual partner is a woman, tell her that you have this infection. She needs to get treatment if she has symptoms. If you have a female partner, he does not need to be treated.  During treatment: ? Avoid sex. ? Do not douche. ? Avoid alcohol as told. ? Avoid breastfeeding as told.  Drink enough fluid to keep your pee (urine) clear or pale yellow.  Keep your vagina and butt (rectum) clean. ? Wash the area with warm water every day. ? Wipe from front to back after you use the toilet.  Keep all follow-up visits as told by your doctor. This is important. Preventing this condition  Do not douche.  Use only warm water to wash around your vagina.  Use protection when you have sex. This includes: ? Latex condoms. ? Dental dams.  Limit how many people you have sex with. It is best to only have sex with the same person (be monogamous).  Get tested for STIs. Have your partner get tested.  Wear underwear that is cotton or lined with cotton.  Avoid tight pants and pantyhose. This is most important in summer.  Do not use any products that have nicotine or tobacco in them. These include  cigarettes and e-cigarettes. If you need help quitting, ask your doctor.  Do not use illegal drugs.  Limit how much alcohol you drink. Contact a doctor if:  Your symptoms do not get better, even after you are treated.  You have more discharge or pain when you pee (urinate).  You have a fever.  You have pain in your belly (abdomen).  You have pain with sex.  Your bleed from your vagina between periods. Summary  This infection happens when too many germs (bacteria) grow in the vagina.  Treating this condition can lower your risk for some infections from sex (STIs).  You should also treat this if you are pregnant. It can cause early (premature) birth.  Do not stop taking or using your antibiotic medicine even if you start to feel better. This information is not intended to replace advice given to you by your health care provider. Make sure you discuss any questions you have with your health care provider. Document Released: 09/03/2008 Document Revised: 08/10/2016 Document Reviewed: 08/10/2016 Elsevier Interactive Patient Education  2019 [employer] Inc.  

## 2018-12-25 NOTE — Progress Notes (Signed)
23 y.o. Single Caucasian female G0P0000 here with complaint of vaginal symptoms of itching externally, and increase odorous light yellow discharge. Onset of symptoms over the past 2 months off and on.. Denies new personal products. No STD concerns, no partner change, but participate in female partner sharing.No concerns for STD's as she was tested. No sexual toy use. Urinary symptoms none . Contraception is condoms. No other health issues today.  Review of Systems  Constitutional: Negative.   HENT: Negative.   Eyes: Negative.   Respiratory: Negative.   Cardiovascular: Negative.   Gastrointestinal: Negative.   Genitourinary: Negative.   Musculoskeletal: Negative.   Skin:       Vaginal itching, irritation & discharge with odor  Neurological: Negative.   Endo/Heme/Allergies: Negative.   Psychiatric/Behavioral: Negative.     O:Healthy female WDWN Affect: normal, orientation x 3  Exam:Skin: warm and dry Abdomen: soft and no masses Lymph node: no enlargement or tenderness Pelvic exam: External genital: normal female BUS: negative Vagina: white watery musty odorous discharge noted. Ph:4.5   ,Wet prep taken, Affirm taken Cervix: normal, non tender, no CMT Uterus: normal, non tender Adnexa:normal, non tender, no masses or fullness noted  Wet Prep results: KOH, Saline  + Clue cells, + yeast   A:Normal pelvic exam Yeast vaginitis BV   P:Discussed findings of BV and yeast vaginitis and etiology. Discussed Aveeno or baking soda sitz bath for comfort. Questions addressed and discussed no sexual activity until all cleared. 2-4 weeks recommended. Rx: Diflucan 150 mg see order with instructions Rx Flagyl 500 mg see order with instructions and alcohol warning given.  Expectations with discharge also discussed. Lab: Affirm  Rv prn

## 2018-12-25 NOTE — Telephone Encounter (Signed)
Patient is asking to talk with Verner Chol nurse, no details given.

## 2018-12-26 LAB — VAGINITIS/VAGINOSIS, DNA PROBE
Candida Species: NEGATIVE
Gardnerella vaginalis: NEGATIVE
Trichomonas vaginosis: NEGATIVE

## 2019-01-12 ENCOUNTER — Other Ambulatory Visit: Payer: Self-pay

## 2019-01-12 ENCOUNTER — Encounter: Payer: Self-pay | Admitting: Certified Nurse Midwife

## 2019-01-12 ENCOUNTER — Ambulatory Visit (INDEPENDENT_AMBULATORY_CARE_PROVIDER_SITE_OTHER): Payer: 59 | Admitting: Certified Nurse Midwife

## 2019-01-12 ENCOUNTER — Other Ambulatory Visit (HOSPITAL_COMMUNITY)
Admission: RE | Admit: 2019-01-12 | Discharge: 2019-01-12 | Disposition: A | Discharge status: Home / Self Care | Payer: 59 | Source: Ambulatory Visit | Attending: Certified Nurse Midwife | Admitting: Certified Nurse Midwife

## 2019-01-12 VITALS — BP 104/62 | HR 60 | Resp 16 | Ht 61.75 in | Wt 113.0 lb

## 2019-01-12 DIAGNOSIS — Z01419 Encounter for gynecological examination (general) (routine) without abnormal findings: Secondary | ICD-10-CM

## 2019-01-12 DIAGNOSIS — Z124 Encounter for screening for malignant neoplasm of cervix: Secondary | ICD-10-CM | POA: Insufficient documentation

## 2019-01-12 DIAGNOSIS — Z8719 Personal history of other diseases of the digestive system: Secondary | ICD-10-CM

## 2019-01-12 NOTE — Patient Instructions (Signed)
General topics  Next pap or exam is  due in 1 year Take a Women's multivitamin Take 1200 mg. of calcium daily - prefer dietary If any concerns in interim to call back  Breast Self-Awareness Practicing breast self-awareness may pick up problems early, prevent significant medical complications, and possibly save your life. By practicing breast self-awareness, you can become familiar with how your breasts look and feel and if your breasts are changing. This allows you to notice changes early. It can also offer you some reassurance that your breast health is good. One way to learn what is normal for your breasts and whether your breasts are changing is to do a breast self-exam. If you find a lump or something that was not present in the past, it is best to contact your caregiver right away. Other findings that should be evaluated by your caregiver include nipple discharge, especially if it is bloody; skin changes or reddening; areas where the skin seems to be pulled in (retracted); or new lumps and bumps. Breast pain is seldom associated with cancer (malignancy), but should also be evaluated by a caregiver. BREAST SELF-EXAM The best time to examine your breasts is 5 7 days after your menstrual period is over.  ExitCare Patient Information 2013 [employer].   Exercise to Stay Healthy Exercise helps you become and stay healthy. EXERCISE IDEAS AND TIPS Choose exercises that:  You enjoy.  Fit into your day. You do not need to exercise really hard to be healthy. You can do exercises at a slow or medium level and stay healthy. You can:  Stretch before and after working out.  Try yoga, Pilates, or tai chi.  Lift weights.  Walk fast, swim, jog, run, climb stairs, bicycle, dance, or rollerskate.  Take aerobic classes. Exercises that burn about 150 calories:  Running 1  miles in 15 minutes.  Playing volleyball for 45 to 60 minutes.  Washing and waxing a car for 45 to 60  minutes.  Playing touch football for 45 minutes.  Walking 1  miles in 35 minutes.  Pushing a stroller 1  miles in 30 minutes.  Playing basketball for 30 minutes.  Raking leaves for 30 minutes.  Bicycling 5 miles in 30 minutes.  Walking 2 miles in 30 minutes.  Dancing for 30 minutes.  Shoveling snow for 15 minutes.  Swimming laps for 20 minutes.  Walking up stairs for 15 minutes.  Bicycling 4 miles in 15 minutes.  Gardening for 30 to 45 minutes.  Jumping rope for 15 minutes.  Washing windows or floors for 45 to 60 minutes. Document Released: 12/28/2010 Document Revised: 02/17/2012 Document Reviewed: 12/28/2010 ExitCare Patient Information 2013 [employer].   Other topics ( that may be useful information):    Sexually Transmitted Disease Sexually transmitted disease (STD) refers to any infection that is passed from person to person during sexual activity. This may happen by way of saliva, semen, blood, vaginal mucus, or urine. Common STDs include:  Gonorrhea.  Chlamydia.  Syphilis.  HIV/AIDS.  Genital herpes.  Hepatitis B and C.  Trichomonas.  Human papillomavirus (HPV).  Pubic lice. CAUSES  An STD may be spread by bacteria, virus, or parasite. A person can get an STD by:  Sexual intercourse with an infected person.  Sharing sex toys with an infected person.  Sharing needles with an infected person.  Having intimate contact with the genitals, mouth, or rectal areas of an infected person. SYMPTOMS  Some people may not have any symptoms, but   they can still pass the infection to others. Different STDs have different symptoms. Symptoms include:  Painful or bloody urination.  Pain in the pelvis, abdomen, vagina, anus, throat, or eyes.  Skin rash, itching, irritation, growths, or sores (lesions). These usually occur in the genital or anal area.  Abnormal vaginal discharge.  Penile discharge in men.  Soft, flesh-colored skin growths in the  genital or anal area.  Fever.  Pain or bleeding during sexual intercourse.  Swollen glands in the groin area.  Yellow skin and eyes (jaundice). This is seen with hepatitis. DIAGNOSIS  To make a diagnosis, your caregiver may:  Take a medical history.  Perform a physical exam.  Take a specimen (culture) to be examined.  Examine a sample of discharge under a microscope.  Perform blood test TREATMENT   Chlamydia, gonorrhea, trichomonas, and syphilis can be cured with antibiotic medicine.  Genital herpes, hepatitis, and HIV can be treated, but not cured, with prescribed medicines. The medicines will lessen the symptoms.  Genital warts from HPV can be treated with medicine or by freezing, burning (electrocautery), or surgery. Warts may come back.  HPV is a virus and cannot be cured with medicine or surgery.However, abnormal areas may be followed very closely by your caregiver and may be removed from the cervix, vagina, or vulva through office procedures or surgery. If your diagnosis is confirmed, your recent sexual partners need treatment. This is true even if they are symptom-free or have a negative culture or evaluation. They should not have sex until their caregiver says it is okay. HOME CARE INSTRUCTIONS  All sexual partners should be informed, tested, and treated for all STDs.  Take your antibiotics as directed. Finish them even if you start to feel better.  Only take over-the-counter or prescription medicines for pain, discomfort, or fever as directed by your caregiver.  Rest.  Eat a balanced diet and drink enough fluids to keep your urine clear or pale yellow.  Do not have sex until treatment is completed and you have followed up with your caregiver. STDs should be checked after treatment.  Keep all follow-up appointments, Pap tests, and blood tests as directed by your caregiver.  Only use latex condoms and water-soluble lubricants during sexual activity. Do not use  petroleum jelly or oils.  Avoid alcohol and illegal drugs.  Get vaccinated for HPV and hepatitis. If you have not received these vaccines in the past, talk to your caregiver about whether one or both might be right for you.  Avoid risky sex practices that can break the skin. The only way to avoid getting an STD is to avoid all sexual activity.Latex condoms and dental dams (for oral sex) will help lessen the risk of getting an STD, but will not completely eliminate the risk. SEEK MEDICAL CARE IF:   You have a fever.  You have any new or worsening symptoms. Document Released: 02/15/2003 Document Revised: 02/17/2012 Document Reviewed: 02/22/2011 [facility] Patient Information 2013 [employer].    Domestic Abuse You are being battered or abused if someone close to you hits, pushes, or physically hurts you in any way. You also are being abused if you are forced into activities. You are being sexually abused if you are forced to have sexual contact of any kind. You are being emotionally abused if you are made to feel worthless or if you are constantly threatened. It is important to remember that help is available. No one has the right to abuse you. PREVENTION OF FURTHER  ABUSE  Learn the warning signs of danger. This varies with situations but may include: the use of alcohol, threats, isolation from friends and family, or forced sexual contact. Leave if you feel that violence is going to occur.  If you are attacked or beaten, report it to the police so the abuse is documented. You do not have to press charges. The police can protect you while you or the attackers are leaving. Get the officer's name and badge number and a copy of the report.  Find someone you can trust and tell them what is happening to you: your caregiver, a nurse, clergy member, close friend or family member. Feeling ashamed is natural, but remember that you have done nothing wrong. No one deserves abuse. Document Released:  11/22/2000 Document Revised: 02/17/2012 Document Reviewed: 01/31/2011 ExitCare Patient Information 2013 [employer].    How Much is Too Much Alcohol? Drinking too much alcohol can cause injury, accidents, and health problems. These types of problems can include:   Car crashes.  Falls.  Family fighting (domestic violence).  Drowning.  Fights.  Injuries.  Burns.  Damage to certain organs.  Having a baby with birth defects. ONE DRINK CAN BE TOO MUCH WHEN YOU ARE:  Working.  Pregnant or breastfeeding.  Taking medicines. Ask your doctor.  Driving or planning to drive. If you or someone you know has a drinking problem, get help from a doctor.  Document Released: 09/21/2009 Document Revised: 02/17/2012 Document Reviewed: 09/21/2009 ExitCare Patient Information 2013 [employer].   Smoking Hazards Smoking cigarettes is extremely bad for your health. Tobacco smoke has over 200 known poisons in it. There are over 60 chemicals in tobacco smoke that cause cancer. Some of the chemicals found in cigarette smoke include:   Cyanide.  Benzene.  Formaldehyde.  Methanol (wood alcohol).  Acetylene (fuel used in welding torches).  Ammonia. Cigarette smoke also contains the poisonous gases nitrogen oxide and carbon monoxide.  Cigarette smokers have an increased risk of many serious medical problems and Smoking causes approximately:  90% of all lung cancer deaths in men.  80% of all lung cancer deaths in women.  90% of deaths from chronic obstructive lung disease. Compared with nonsmokers, smoking increases the risk of:  Coronary heart disease by 2 to 4 times.  Stroke by 2 to 4 times.  Men developing lung cancer by 23 times.  Women developing lung cancer by 13 times.  Dying from chronic obstructive lung diseases by 12 times.  . Smoking is the most preventable cause of death and disease in our society.  WHY IS SMOKING ADDICTIVE?  Nicotine is the chemical  agent in tobacco that is capable of causing addiction or dependence.  When you smoke and inhale, nicotine is absorbed rapidly into the bloodstream through your lungs. Nicotine absorbed through the lungs is capable of creating a powerful addiction. Both inhaled and non-inhaled nicotine may be addictive.  Addiction studies of cigarettes and spit tobacco show that addiction to nicotine occurs mainly during the teen years, when young people begin using tobacco products. WHAT ARE THE BENEFITS OF QUITTING?  There are many health benefits to quitting smoking.   Likelihood of developing cancer and heart disease decreases. Health improvements are seen almost immediately.  Blood pressure, pulse rate, and breathing patterns start returning to normal soon after quitting. QUITTING SMOKING   American Lung Association - 1-800-LUNGUSA  American Cancer Society - 1-800-ACS-2345 Document Released: 01/02/2005 Document Revised: 02/17/2012 Document Reviewed: 09/06/2009 ExitCare Patient Information 2013 ExitCare[employer]   LL[employer].   Stress Management Stress is a state of physical or mental tension that often results from changes in your life or normal routine. Some common causes of stress are:  Death of a loved one.  Injuries or severe illnesses.  Getting fired or changing jobs.  Moving into a new home. Other causes may be:  Sexual problems.  Business or financial losses.  Taking on a large debt.  Regular conflict with someone at home or at work.  Constant tiredness from lack of sleep. It is not just bad things that are stressful. It may be stressful to:  Win the lottery.  Get married.  Buy a new car. The amount of stress that can be easily tolerated varies from person to person. Changes generally cause stress, regardless of the types of change. Too much stress can affect your health. It may lead to physical or emotional problems. Too little stress (boredom) may also become stressful. SUGGESTIONS TO  REDUCE STRESS:  Talk things over with your family and friends. It often is helpful to share your concerns and worries. If you feel your problem is serious, you may want to get help from a professional counselor.  Consider your problems one at a time instead of lumping them all together. Trying to take care of everything at once may seem impossible. List all the things you need to do and then start with the most important one. Set a goal to accomplish 2 or 3 things each day. If you expect to do too many in a single day you will naturally fail, causing you to feel even more stressed.  Do not use alcohol or drugs to relieve stress. Although you may feel better for a short time, they do not remove the problems that caused the stress. They can also be habit forming.  Exercise regularly - at least 3 times per week. Physical exercise can help to relieve that "uptight" feeling and will relax you.  The shortest distance between despair and hope is often a good night's sleep.  Go to bed and get up on time allowing yourself time for appointments without being rushed.  Take a short "time-out" period from any stressful situation that occurs during the day. Close your eyes and take some deep breaths. Starting with the muscles in your face, tense them, hold it for a few seconds, then relax. Repeat this with the muscles in your neck, shoulders, hand, stomach, back and legs.  Take good care of yourself. Eat a balanced diet and get plenty of rest.  Schedule time for having fun. Take a break from your daily routine to relax. HOME CARE INSTRUCTIONS   Call if you feel overwhelmed by your problems and feel you can no longer manage them on your own.  Return immediately if you feel like hurting yourself or someone else. Document Released: 05/21/2001 Document Revised: 02/17/2012 Document Reviewed: 01/11/2008 ExitCare Patient Information 2013 [employer].   
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
## 2019-01-14 LAB — CYTOLOGY - PAP: Diagnosis: NEGATIVE

## 2019-02-24 ENCOUNTER — Telehealth: Payer: Self-pay | Admitting: Family Medicine

## 2019-02-24 ENCOUNTER — Encounter: Payer: Self-pay | Admitting: Internal Medicine

## 2019-02-24 NOTE — Telephone Encounter (Signed)
Pt's mom coming in today for a letter

## 2019-02-24 NOTE — Telephone Encounter (Signed)
After getting more information from the patient, her symptoms sound more flu-like (acute onset of fever, chills, bodyaches, nasal congestion) and she has not had any direct contact with anyone who is under investigation for Covid-19. She seems to be low risk. Recommend that she treat her symptoms and stay well hydrated. She will self quarantine until she has been fever free for 24 hours without medication. I will give her a work note for the next 2 days. She will call back if symptoms are worsening and will call Friday to let me know how she is doing.

## 2019-02-24 NOTE — Telephone Encounter (Signed)
Pt called and states that she is running a fever, has coughing and shortness of breath. She is also experiencing some congestion and diarrhea. She has been in contact with someone who is in close contact with 2 people that have been tested. Pt has done no traveling. Pt was informed that she needed to self quarantine until told other wise and she states he boss is telling her to come in. Pt was informed that she can not go to work until results are back. If she continues to have issues with her boss she needs to call us back and we can get out Engineer, manufacturing involved.

## 2019-07-19 ENCOUNTER — Ambulatory Visit: Payer: 59 | Admitting: Family Medicine

## 2019-07-19 ENCOUNTER — Other Ambulatory Visit: Payer: Self-pay

## 2019-07-19 ENCOUNTER — Encounter: Payer: Self-pay | Admitting: Family Medicine

## 2019-07-19 VITALS — Temp 99.4°F | Wt 113.0 lb

## 2019-07-19 DIAGNOSIS — Z20822 Contact with and (suspected) exposure to covid-19: Secondary | ICD-10-CM

## 2019-07-19 DIAGNOSIS — R6889 Other general symptoms and signs: Secondary | ICD-10-CM

## 2019-07-19 NOTE — Progress Notes (Signed)
   Subjective:    Patient ID: Dana Fisher, female    DOB: 02-15-1996, 23 y.o.   MRN: [id number]  HPI Documentation for virtual telephone encounter.  Documentation for virtual audio and video telecommunications through [place] encounter:  The patient was located at home. The provider was located in the office. The patient did consent to this visit and is aware of possible charges through their insurance for this visit.  The other persons participating in this telemedicine service were none. Time spent on call was 5 minutes This virtual service is not related to other E/M service within previous 7 days. She states that on Saturday she developed a cough and a slight sore throat followed Sunday by fever, myalgias, headache, chest congestion with anorexia and questionable change in smell and taste.  She spent some time in [facility] but states that she did wear her mask into social distancing when she was there.  She lives with her boyfriend and apparently he is having no symptoms.   Review of Systems     Objective:   Physical Exam Alert and in no distress but visually does look fatigued.       Assessment & Plan:   Encounter Diagnosis  Name Primary?  . Suspected Covid-19 Virus Infection Yes  [facility].  Also suggested that she get her boyfriend checked.  Recommend she treat her fever with Tylenol and/or NSAID.  She is to follow social distancing for the next several days until we get the results back.  She expressed understanding of this.

## 2019-07-20 LAB — NOVEL CORONAVIRUS, NAA: SARS-CoV-2, NAA: NOT DETECTED

## 2019-07-21 ENCOUNTER — Telehealth: Payer: Self-pay | Admitting: Family Medicine

## 2019-07-21 NOTE — Telephone Encounter (Signed)
Pt was advised K[facility] 

## 2019-07-21 NOTE — Telephone Encounter (Signed)
Have her gargle with Chloraseptic or whatever her favorite mouthwashes basically treat the symptoms.  Advil or Aleve for general pain relief.

## 2019-07-21 NOTE — Telephone Encounter (Signed)
Pt's mother called and states that pt's covid test came back negative. But pt still has a very sore throat. Please advise pt at [phone]. Pt uses [facility] on [address] in [place]

## 2019-12-06 LAB — PREGNANCY, URINE: Preg Test, Ur: POSITIVE

## 2020-01-06 LAB — OB RESULTS CONSOLE ABO/RH: RH Type: NEGATIVE

## 2020-01-06 LAB — OB RESULTS CONSOLE HIV ANTIBODY (ROUTINE TESTING): HIV: NONREACTIVE

## 2020-01-06 LAB — OB RESULTS CONSOLE HEPATITIS B SURFACE ANTIGEN: Hepatitis B Surface Ag: NEGATIVE

## 2020-01-06 LAB — OB RESULTS CONSOLE RUBELLA ANTIBODY, IGM: Rubella: IMMUNE

## 2020-01-11 ENCOUNTER — Encounter: Payer: Self-pay | Admitting: *Deleted

## 2020-01-11 LAB — OB RESULTS CONSOLE GC/CHLAMYDIA
Chlamydia: NEGATIVE
Gonorrhea: NEGATIVE

## 2020-01-14 ENCOUNTER — Ambulatory Visit: Payer: 59 | Admitting: Certified Nurse Midwife

## 2020-01-20 ENCOUNTER — Encounter: Payer: 59 | Admitting: Obstetrics and Gynecology

## 2020-02-21 ENCOUNTER — Encounter: Payer: Self-pay | Admitting: Certified Nurse Midwife

## 2020-02-23 ENCOUNTER — Encounter: Payer: Self-pay | Admitting: Certified Nurse Midwife

## 2020-05-15 ENCOUNTER — Telehealth: Payer: Self-pay | Admitting: Family Medicine

## 2020-05-15 LAB — OB RESULTS CONSOLE HIV ANTIBODY (ROUTINE TESTING): HIV: NONREACTIVE

## 2020-05-15 NOTE — Telephone Encounter (Signed)
Pt called, she is [redacted] weeks pregnant, HA, weakness, cough, abd pain, diarrhea, vomiting, sore throat, fatigue, runny nose. She states her GYN said she needs strep test.   Discussed with Vickie, patient also needs Covid test.  Advised pt to go to Urgent Care and have Covid and strep test done.  She understands.

## 2020-07-10 LAB — OB RESULTS CONSOLE GBS: GBS: NEGATIVE

## 2020-08-01 ENCOUNTER — Other Ambulatory Visit: Payer: Self-pay

## 2020-08-01 ENCOUNTER — Inpatient Hospital Stay (HOSPITAL_COMMUNITY): Payer: 59 | Admitting: Anesthesiology

## 2020-08-01 ENCOUNTER — Encounter (HOSPITAL_COMMUNITY): Payer: Self-pay | Admitting: Obstetrics & Gynecology

## 2020-08-01 ENCOUNTER — Inpatient Hospital Stay (HOSPITAL_COMMUNITY)
Admission: AD | Admit: 2020-08-01 | Discharge: 2020-08-04 | DRG: 788 | Disposition: A | Discharge status: Home / Self Care | Payer: 59 | Attending: Obstetrics & Gynecology | Admitting: Obstetrics & Gynecology

## 2020-08-01 ENCOUNTER — Inpatient Hospital Stay (HOSPITAL_COMMUNITY): Payer: 59

## 2020-08-01 DIAGNOSIS — Z87891 Personal history of nicotine dependence: Secondary | ICD-10-CM | POA: Diagnosis not present

## 2020-08-01 DIAGNOSIS — E669 Obesity, unspecified: Secondary | ICD-10-CM | POA: Diagnosis present

## 2020-08-01 DIAGNOSIS — O99214 Obesity complicating childbirth: Secondary | ICD-10-CM | POA: Diagnosis present

## 2020-08-01 DIAGNOSIS — Z3490 Encounter for supervision of normal pregnancy, unspecified, unspecified trimester: Secondary | ICD-10-CM

## 2020-08-01 DIAGNOSIS — E039 Hypothyroidism, unspecified: Secondary | ICD-10-CM | POA: Diagnosis present

## 2020-08-01 DIAGNOSIS — Z3A39 39 weeks gestation of pregnancy: Secondary | ICD-10-CM

## 2020-08-01 DIAGNOSIS — O26893 Other specified pregnancy related conditions, third trimester: Secondary | ICD-10-CM | POA: Diagnosis present

## 2020-08-01 DIAGNOSIS — Z20822 Contact with and (suspected) exposure to covid-19: Secondary | ICD-10-CM | POA: Diagnosis present

## 2020-08-01 DIAGNOSIS — O99284 Endocrine, nutritional and metabolic diseases complicating childbirth: Principal | ICD-10-CM | POA: Diagnosis present

## 2020-08-01 DIAGNOSIS — Z6791 Unspecified blood type, Rh negative: Secondary | ICD-10-CM | POA: Diagnosis not present

## 2020-08-01 LAB — CBC
HCT: 30.9 % — ABNORMAL LOW (ref 36.0–46.0)
Hemoglobin: 9.7 g/dL — ABNORMAL LOW (ref 12.0–15.0)
MCH: 26.5 pg (ref 26.0–34.0)
MCHC: 31.4 g/dL (ref 30.0–36.0)
MCV: 84.4 fL (ref 80.0–100.0)
Platelets: 330 10*3/uL (ref 150–400)
RBC: 3.66 MIL/uL — ABNORMAL LOW (ref 3.87–5.11)
RDW: 14.2 % (ref 11.5–15.5)
WBC: 13.1 10*3/uL — ABNORMAL HIGH (ref 4.0–10.5)
nRBC: 0.2 % (ref 0.0–0.2)

## 2020-08-01 LAB — TYPE AND SCREEN
ABO/RH(D): O NEG
Antibody Screen: NEGATIVE

## 2020-08-01 LAB — COMPREHENSIVE METABOLIC PANEL
ALT: 25 U/L (ref 0–44)
AST: 31 U/L (ref 15–41)
Albumin: 2.9 g/dL — ABNORMAL LOW (ref 3.5–5.0)
Alkaline Phosphatase: 169 U/L — ABNORMAL HIGH (ref 38–126)
Anion gap: 10 (ref 5–15)
BUN: 7 mg/dL (ref 6–20)
CO2: 20 mmol/L — ABNORMAL LOW (ref 22–32)
Calcium: 9.6 mg/dL (ref 8.9–10.3)
Chloride: 105 mmol/L (ref 98–111)
Creatinine, Ser: 0.57 mg/dL (ref 0.44–1.00)
GFR calc Af Amer: >60 mL/min (ref >60)
GFR calc non Af Amer: >60 mL/min (ref >60)
Glucose, Bld: 85 mg/dL (ref 70–99)
Potassium: 4.2 mmol/L (ref 3.5–5.1)
Sodium: 135 mmol/L (ref 135–145)
Total Bilirubin: 0.5 mg/dL (ref 0.3–1.2)
Total Protein: 5.8 g/dL — ABNORMAL LOW (ref 6.5–8.1)

## 2020-08-01 LAB — RPR: RPR Ser Ql: NONREACTIVE

## 2020-08-01 LAB — SARS CORONAVIRUS 2 BY RT PCR (HOSPITAL ORDER, PERFORMED IN ~~LOC~~ HOSPITAL LAB): SARS Coronavirus 2: NEGATIVE

## 2020-08-01 MED ORDER — OXYCODONE-ACETAMINOPHEN 5-325 MG PO TABS: 1.0000 | ORAL_TABLET | ORAL | Status: DC | PRN

## 2020-08-01 MED ORDER — LACTATED RINGERS IV SOLN: 500.0000 mL | INTRAVENOUS | Status: DC | PRN

## 2020-08-01 MED ORDER — LACTATED RINGERS IV SOLN: INTRAVENOUS | Status: DC

## 2020-08-01 MED ORDER — OXYTOCIN-SODIUM CHLORIDE 30-0.9 UT/500ML-% IV SOLN: 1.0000 m[IU]/min | INTRAVENOUS | Status: DC

## 2020-08-01 MED ORDER — ACETAMINOPHEN 325 MG PO TABS: 650.0000 mg | ORAL_TABLET | ORAL | Status: DC | PRN

## 2020-08-01 MED ORDER — LIDOCAINE HCL (PF) 1 % IJ SOLN
INTRAMUSCULAR | Status: DC | PRN
  Administered 2020-08-01: 2 mL via EPIDURAL
  Administered 2020-08-01: 10 mL via EPIDURAL

## 2020-08-01 MED ORDER — TERBUTALINE SULFATE 1 MG/ML IJ SOLN: 0.2500 mg | Freq: Once | INTRAMUSCULAR | Status: DC | PRN

## 2020-08-01 MED ORDER — MISOPROSTOL 25 MCG QUARTER TABLET
25.0000 ug | ORAL_TABLET | ORAL | Status: DC | PRN
  Administered 2020-08-01 (×2): 25 ug via VAGINAL
  Filled 2020-08-01 (×2): qty 1

## 2020-08-01 MED ORDER — LIDOCAINE HCL (PF) 1 % IJ SOLN: 30.0000 mL | INTRAMUSCULAR | Status: DC | PRN

## 2020-08-01 MED ORDER — BUPIVACAINE HCL (PF) 0.75 % IJ SOLN
INTRAMUSCULAR | Status: DC | PRN
Start: 2020-08-01 — End: 2020-08-02
  Administered 2020-08-01: 12 mL/h via EPIDURAL

## 2020-08-01 MED ORDER — OXYTOCIN BOLUS FROM INFUSION: 333.0000 mL | Freq: Once | INTRAVENOUS | Status: DC

## 2020-08-01 MED ORDER — FENTANYL CITRATE (PF) 100 MCG/2ML IJ SOLN: 50.0000 ug | INTRAMUSCULAR | Status: DC | PRN

## 2020-08-01 MED ORDER — LEVOTHYROXINE SODIUM 50 MCG PO TABS
50.0000 ug | ORAL_TABLET | Freq: Every day | ORAL | Status: DC
  Filled 2020-08-01 (×2): qty 1

## 2020-08-01 MED ORDER — OXYCODONE-ACETAMINOPHEN 5-325 MG PO TABS: 2.0000 | ORAL_TABLET | ORAL | Status: DC | PRN

## 2020-08-01 MED ORDER — OXYTOCIN-SODIUM CHLORIDE 30-0.9 UT/500ML-% IV SOLN
2.5000 [IU]/h | INTRAVENOUS | Status: DC
  Administered 2020-08-02: 30 [IU] via INTRAVENOUS
  Filled 2020-08-01: qty 500

## 2020-08-01 MED ORDER — SOD CITRATE-CITRIC ACID 500-334 MG/5ML PO SOLN
30.0000 mL | ORAL | Status: DC | PRN
  Administered 2020-08-01 – 2020-08-02 (×2): 30 mL via ORAL
  Filled 2020-08-01 (×2): qty 30

## 2020-08-01 MED ORDER — ONDANSETRON HCL 4 MG/2ML IJ SOLN
4.0000 mg | Freq: Four times a day (QID) | INTRAMUSCULAR | Status: DC | PRN
  Administered 2020-08-01 – 2020-08-02 (×2): 4 mg via INTRAVENOUS
  Filled 2020-08-01: qty 2

## 2020-08-01 MED ORDER — OXYTOCIN-SODIUM CHLORIDE 30-0.9 UT/500ML-% IV SOLN
1.0000 m[IU]/min | INTRAVENOUS | Status: DC
  Administered 2020-08-01: 2 m[IU]/min via INTRAVENOUS

## 2020-08-01 MED ORDER — FENTANYL-BUPIVACAINE-NACL 0.5-0.125-0.9 MG/250ML-% EP SOLN
EPIDURAL | Status: AC
Start: 2020-08-01 — End: 2020-08-02
  Filled 2020-08-01: qty 250

## 2020-08-01 MED ORDER — ZOLPIDEM TARTRATE 5 MG PO TABS: 5.0000 mg | ORAL_TABLET | Freq: Every evening | ORAL | Status: DC | PRN

## 2020-08-01 NOTE — Progress Notes (Signed)
CVX 1/50/-3, FB placed Start pitocin CLEA when desired FHT Cat I  Mitchel Honour, DO

## 2020-08-01 NOTE — Progress Notes (Signed)
Dana Fisher is a 24 y.o. G1P0000 at [redacted]w[redacted]d by ultrasound admitted for induction of labor due to Elective at term.  Subjective: Feeling more intensity with CTX.   Objective: BP 132/86   Pulse 77   Temp 98.3 F (36.8 C) (Oral)   Resp 16   Ht 5\' 1"  (1.549 m)   Wt 82.3 kg   SpO2 99%   BMI 34.28 kg/m  No intake/output data recorded. No intake/output data recorded.  FHT:  FHR: 135 bpm, variability: moderate,  accelerations:  Present,  decelerations:  Absent UC:   regular, every 2 minutes SVE:   Dilation: 4.5 Effacement (%): 70 Station: -2 Exam by:: wilkins rnc AROM, clear  Labs: Lab Results  Component Value Date   WBC 13.1 (H) 08/01/2020   HGB 9.7 (L) 08/01/2020   HCT 30.9 (L) 08/01/2020   MCV 84.4 08/01/2020   PLT 330 08/01/2020    Assessment / Plan: Induction of labor due to maternal request,  progressing well on pitocin  Labor: Progressing normally; continue pitocin Preeclampsia:  n/a Fetal Wellbeing:  Category I Pain Control:  Epidural; would like to get it now I/D:  n/a Anticipated MOD:  NSVD  08/03/2020 08/01/2020, 12:46 PM
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
## 2020-08-01 NOTE — Progress Notes (Signed)
Dana Fisher is a 24 y.o. G1P0000 at [redacted]w[redacted]d by ultrasound admitted for induction of labor due to Elective at term.  Subjective: Comfortable with CLEA  Objective: BP 114/76    Pulse 72    Temp 98.3 F (36.8 C) (Oral)    Resp 16    Ht 5\' 1"  (1.549 m)    Wt 82.3 kg    SpO2 99%    BMI 34.28 kg/m  No intake/output data recorded. No intake/output data recorded.  FHT:  FHR: 130 bpm, variability: moderate,  accelerations:  Present,  decelerations:  Absent UC:   regular, every 2-3 minutes SVE:   Dilation: (P) 4.5 Effacement (%): (P) 70 Station: (P) -2 Exam by:: (P) mwilkins rnc  IUPC placed  Labs: Lab Results  Component Value Date   WBC 13.1 (H) 08/01/2020   HGB 9.7 (L) 08/01/2020   HCT 30.9 (L) 08/01/2020   MCV 84.4 08/01/2020   PLT 330 08/01/2020    Assessment / Plan: Induction of labor due to maternal request,  progressing well on pitocin  Labor: Progressing normally and foley bulb cervix; IUPC placed and will monitor MVUs Preeclampsia:  n/a Fetal Wellbeing:  Category I Pain Control:  Epidural I/D:  n/a Anticipated MOD:  NSVD  08/03/2020 08/01/2020, 5:39 PM

## 2020-08-01 NOTE — Anesthesia Procedure Notes (Signed)
Epidural Patient location during procedure: OB Start time: 08/01/2020 12:53 PM End time: 08/01/2020 1:05 PM  Staffing Anesthesiologist: Lannie Fields, DO Performed: anesthesiologist   Preanesthetic Checklist Completed: patient identified, IV checked, risks and benefits discussed, monitors and equipment checked, pre-op evaluation and timeout performed  Epidural Patient position: sitting Prep: DuraPrep and site prepped and draped Patient monitoring: continuous pulse ox, blood pressure, heart rate and cardiac monitor Approach: midline Location: L3-L4 Injection technique: LOR air  Needle:  Needle type: Tuohy  Needle gauge: 17 G Needle length: 9 cm Needle insertion depth: 5 cm Catheter type: closed end flexible Catheter size: 19 Gauge Catheter at skin depth: 10 cm Test dose: negative  Assessment Sensory level: T8 Events: blood not aspirated, injection not painful, no injection resistance, no paresthesia and negative IV test  Additional Notes Patient identified. Risks/Benefits/Options discussed with patient including but not limited to bleeding, infection, nerve damage, paralysis, failed block, incomplete pain control, headache, blood pressure changes, nausea, vomiting, reactions to medication both or allergic, itching and postpartum back pain. Confirmed with bedside nurse the patient's most recent platelet count. Confirmed with patient that they are not currently taking any anticoagulation, have any bleeding history or any family history of bleeding disorders. Patient expressed understanding and wished to proceed. All questions were answered. Sterile technique was used throughout the entire procedure. Please see nursing notes for vital signs. Test dose was given through epidural catheter and negative prior to continuing to dose epidural or start infusion. Warning signs of high block given to the patient including shortness of breath, tingling/numbness in hands, complete motor  block, or any concerning symptoms with instructions to call for help. Patient was given instructions on fall risk and not to get out of bed. All questions and concerns addressed with instructions to call with any issues or inadequate analgesia.  Reason for block:procedure for pain

## 2020-08-02 ENCOUNTER — Encounter (HOSPITAL_COMMUNITY): Payer: Self-pay | Admitting: Obstetrics & Gynecology

## 2020-08-02 ENCOUNTER — Encounter (HOSPITAL_COMMUNITY)
Admission: AD | Disposition: A | Discharge status: Home / Self Care | Payer: Self-pay | Source: Home / Self Care | Attending: Obstetrics & Gynecology

## 2020-08-02 SURGERY — Surgical Case
Anesthesia: Epidural | Wound class: Clean Contaminated

## 2020-08-02 MED ORDER — KETOROLAC TROMETHAMINE 30 MG/ML IJ SOLN: 30.0000 mg | Freq: Four times a day (QID) | INTRAMUSCULAR | Status: AC | PRN

## 2020-08-02 MED ORDER — COCONUT OIL OIL
1.0000 "application " | TOPICAL_OIL | Status: DC | PRN
  Administered 2020-08-03: 1 via TOPICAL

## 2020-08-02 MED ORDER — LIDOCAINE-EPINEPHRINE (PF) 2 %-1:200000 IJ SOLN
INTRAMUSCULAR | Status: DC | PRN
  Administered 2020-08-02: 10 mL via EPIDURAL
  Administered 2020-08-02: 5 mL via EPIDURAL
  Administered 2020-08-02: 3 mL via EPIDURAL
  Administered 2020-08-02: 5 mL via EPIDURAL

## 2020-08-02 MED ORDER — TETANUS-DIPHTH-ACELL PERTUSSIS 5-2.5-18.5 LF-MCG/0.5 IM SUSP: 0.5000 mL | Freq: Once | INTRAMUSCULAR | Status: DC

## 2020-08-02 MED ORDER — FENTANYL CITRATE (PF) 100 MCG/2ML IJ SOLN
INTRAMUSCULAR | Status: AC
  Filled 2020-08-02: qty 2

## 2020-08-02 MED ORDER — DEXAMETHASONE SODIUM PHOSPHATE 10 MG/ML IJ SOLN
INTRAMUSCULAR | Status: DC | PRN
  Administered 2020-08-02: 10 mg via INTRAVENOUS

## 2020-08-02 MED ORDER — PRENATAL MULTIVITAMIN CH
1.0000 | ORAL_TABLET | Freq: Every day | ORAL | Status: DC
  Administered 2020-08-02 – 2020-08-04 (×3): 1 via ORAL
  Filled 2020-08-02 (×3): qty 1

## 2020-08-02 MED ORDER — SENNOSIDES-DOCUSATE SODIUM 8.6-50 MG PO TABS
2.0000 | ORAL_TABLET | ORAL | Status: DC
  Administered 2020-08-02 – 2020-08-03 (×2): 2 via ORAL
  Filled 2020-08-02 (×2): qty 2

## 2020-08-02 MED ORDER — NALOXONE HCL 0.4 MG/ML IJ SOLN: 0.4000 mg | INTRAMUSCULAR | Status: DC | PRN

## 2020-08-02 MED ORDER — OXYTOCIN-SODIUM CHLORIDE 30-0.9 UT/500ML-% IV SOLN
INTRAVENOUS | Status: AC
  Filled 2020-08-02: qty 500

## 2020-08-02 MED ORDER — ONDANSETRON HCL 4 MG/2ML IJ SOLN: 4.0000 mg | Freq: Three times a day (TID) | INTRAMUSCULAR | Status: DC | PRN

## 2020-08-02 MED ORDER — DEXMEDETOMIDINE (PRECEDEX) IN NS 20 MCG/5ML (4 MCG/ML) IV SYRINGE
PREFILLED_SYRINGE | INTRAVENOUS | Status: DC | PRN
  Administered 2020-08-02 (×5): 4 ug via INTRAVENOUS

## 2020-08-02 MED ORDER — SIMETHICONE 80 MG PO CHEW
80.0000 mg | CHEWABLE_TABLET | Freq: Three times a day (TID) | ORAL | Status: DC
  Administered 2020-08-02 – 2020-08-04 (×8): 80 mg via ORAL
  Filled 2020-08-02 (×8): qty 1

## 2020-08-02 MED ORDER — DEXAMETHASONE SODIUM PHOSPHATE 10 MG/ML IJ SOLN
INTRAMUSCULAR | Status: AC
  Filled 2020-08-02: qty 1

## 2020-08-02 MED ORDER — FENTANYL-BUPIVACAINE-NACL 0.5-0.125-0.9 MG/250ML-% EP SOLN
EPIDURAL | Status: AC
  Filled 2020-08-02: qty 250

## 2020-08-02 MED ORDER — DIBUCAINE (PERIANAL) 1 % EX OINT
1.0000 "application " | TOPICAL_OINTMENT | CUTANEOUS | Status: DC | PRN
  Administered 2020-08-04: 1 via RECTAL
  Filled 2020-08-02: qty 28

## 2020-08-02 MED ORDER — ONDANSETRON HCL 4 MG/2ML IJ SOLN
INTRAMUSCULAR | Status: AC
  Filled 2020-08-02: qty 2

## 2020-08-02 MED ORDER — DIPHENHYDRAMINE HCL 25 MG PO CAPS: 25.0000 mg | ORAL_CAPSULE | ORAL | Status: DC | PRN

## 2020-08-02 MED ORDER — MORPHINE SULFATE (PF) 0.5 MG/ML IJ SOLN
INTRAMUSCULAR | Status: AC
  Filled 2020-08-02: qty 10

## 2020-08-02 MED ORDER — NALOXONE HCL 4 MG/10ML IJ SOLN
1.0000 ug/kg/h | INTRAVENOUS | Status: DC | PRN
  Filled 2020-08-02: qty 5

## 2020-08-02 MED ORDER — FENTANYL CITRATE (PF) 100 MCG/2ML IJ SOLN
INTRAMUSCULAR | Status: DC | PRN
  Administered 2020-08-02: 100 ug via EPIDURAL

## 2020-08-02 MED ORDER — MEPERIDINE HCL 25 MG/ML IJ SOLN: 6.2500 mg | INTRAMUSCULAR | Status: DC | PRN

## 2020-08-02 MED ORDER — DIPHENHYDRAMINE HCL 25 MG PO CAPS: 25.0000 mg | ORAL_CAPSULE | Freq: Four times a day (QID) | ORAL | Status: DC | PRN

## 2020-08-02 MED ORDER — NALBUPHINE HCL 10 MG/ML IJ SOLN: 5.0000 mg | Freq: Once | INTRAMUSCULAR | Status: DC | PRN

## 2020-08-02 MED ORDER — MEPERIDINE HCL 25 MG/ML IJ SOLN
INTRAMUSCULAR | Status: AC
  Filled 2020-08-02: qty 1

## 2020-08-02 MED ORDER — KETOROLAC TROMETHAMINE 30 MG/ML IJ SOLN
INTRAMUSCULAR | Status: AC
  Filled 2020-08-02: qty 1

## 2020-08-02 MED ORDER — MORPHINE SULFATE (PF) 0.5 MG/ML IJ SOLN
INTRAMUSCULAR | Status: DC | PRN
Start: 2020-08-02 — End: 2020-08-02
  Administered 2020-08-02: 3 mg via EPIDURAL

## 2020-08-02 MED ORDER — CEFAZOLIN SODIUM-DEXTROSE 2-4 GM/100ML-% IV SOLN
INTRAVENOUS | Status: AC
  Filled 2020-08-02: qty 100

## 2020-08-02 MED ORDER — IBUPROFEN 800 MG PO TABS
800.0000 mg | ORAL_TABLET | Freq: Four times a day (QID) | ORAL | Status: DC
  Administered 2020-08-02 – 2020-08-04 (×9): 800 mg via ORAL
  Filled 2020-08-02 (×9): qty 1

## 2020-08-02 MED ORDER — OXYCODONE-ACETAMINOPHEN 5-325 MG PO TABS
1.0000 | ORAL_TABLET | ORAL | Status: DC | PRN
  Administered 2020-08-03 – 2020-08-04 (×7): 1 via ORAL
  Filled 2020-08-02 (×7): qty 1
  Filled 2020-08-02: qty 2

## 2020-08-02 MED ORDER — LEVOTHYROXINE SODIUM 50 MCG PO TABS
50.0000 ug | ORAL_TABLET | Freq: Every day | ORAL | Status: DC
  Administered 2020-08-02 – 2020-08-04 (×3): 50 ug via ORAL
  Filled 2020-08-02 (×3): qty 1

## 2020-08-02 MED ORDER — ZOLPIDEM TARTRATE 5 MG PO TABS: 5.0000 mg | ORAL_TABLET | Freq: Every evening | ORAL | Status: DC | PRN

## 2020-08-02 MED ORDER — FENTANYL CITRATE (PF) 100 MCG/2ML IJ SOLN: 25.0000 ug | INTRAMUSCULAR | Status: DC | PRN

## 2020-08-02 MED ORDER — MEPERIDINE HCL 25 MG/ML IJ SOLN
INTRAMUSCULAR | Status: DC | PRN
Start: 2020-08-02 — End: 2020-08-02
  Administered 2020-08-02 (×2): 12.5 mg via INTRAVENOUS

## 2020-08-02 MED ORDER — MENTHOL 3 MG MT LOZG: 1.0000 | LOZENGE | OROMUCOSAL | Status: DC | PRN

## 2020-08-02 MED ORDER — DEXMEDETOMIDINE (PRECEDEX) IN NS 20 MCG/5ML (4 MCG/ML) IV SYRINGE
PREFILLED_SYRINGE | INTRAVENOUS | Status: AC
  Filled 2020-08-02: qty 5

## 2020-08-02 MED ORDER — ACETAMINOPHEN 325 MG PO TABS
650.0000 mg | ORAL_TABLET | ORAL | Status: DC | PRN
  Administered 2020-08-02: 650 mg via ORAL
  Filled 2020-08-02: qty 2

## 2020-08-02 MED ORDER — LACTATED RINGERS IV SOLN: INTRAVENOUS | Status: DC

## 2020-08-02 MED ORDER — NALBUPHINE HCL 10 MG/ML IJ SOLN: 5.0000 mg | INTRAMUSCULAR | Status: DC | PRN

## 2020-08-02 MED ORDER — OXYTOCIN-SODIUM CHLORIDE 30-0.9 UT/500ML-% IV SOLN: 2.5000 [IU]/h | INTRAVENOUS | Status: AC

## 2020-08-02 MED ORDER — CEFAZOLIN SODIUM-DEXTROSE 2-4 GM/100ML-% IV SOLN: 2.0000 g | Freq: Once | INTRAVENOUS | Status: DC

## 2020-08-02 MED ORDER — PHENYLEPHRINE HCL (PRESSORS) 10 MG/ML IV SOLN
INTRAVENOUS | Status: DC | PRN
  Administered 2020-08-02 (×4): 80 ug via INTRAVENOUS

## 2020-08-02 MED ORDER — SIMETHICONE 80 MG PO CHEW: 80.0000 mg | CHEWABLE_TABLET | ORAL | Status: DC | PRN

## 2020-08-02 MED ORDER — SIMETHICONE 80 MG PO CHEW
80.0000 mg | CHEWABLE_TABLET | ORAL | Status: DC
  Administered 2020-08-02 – 2020-08-03 (×2): 80 mg via ORAL
  Filled 2020-08-02 (×2): qty 1

## 2020-08-02 MED ORDER — SODIUM CHLORIDE 0.9 % IR SOLN
Status: DC | PRN
  Administered 2020-08-02 (×2): 1

## 2020-08-02 MED ORDER — KETOROLAC TROMETHAMINE 30 MG/ML IJ SOLN
30.0000 mg | Freq: Four times a day (QID) | INTRAMUSCULAR | Status: AC | PRN
  Administered 2020-08-02: 30 mg via INTRAMUSCULAR

## 2020-08-02 MED ORDER — SODIUM CHLORIDE 0.9% FLUSH: 3.0000 mL | INTRAVENOUS | Status: DC | PRN

## 2020-08-02 MED ORDER — PHENYLEPHRINE 40 MCG/ML (10ML) SYRINGE FOR IV PUSH (FOR BLOOD PRESSURE SUPPORT)
PREFILLED_SYRINGE | INTRAVENOUS | Status: AC
  Filled 2020-08-02: qty 10

## 2020-08-02 MED ORDER — WITCH HAZEL-GLYCERIN EX PADS
1.0000 "application " | MEDICATED_PAD | CUTANEOUS | Status: DC | PRN
  Administered 2020-08-04: 1 via TOPICAL

## 2020-08-02 MED ORDER — DIPHENHYDRAMINE HCL 50 MG/ML IJ SOLN: 12.5000 mg | INTRAMUSCULAR | Status: DC | PRN

## 2020-08-02 SURGICAL SUPPLY — 33 items
BENZOIN TINCTURE PRP APPL 2/3 (GAUZE/BANDAGES/DRESSINGS) ×3 IMPLANT
CHLORAPREP W/TINT 26ML (MISCELLANEOUS) ×3 IMPLANT
CLAMP CORD UMBIL (MISCELLANEOUS) IMPLANT
CLOSURE WOUND 1/2 X4 (GAUZE/BANDAGES/DRESSINGS) ×1
CLOTH BEACON ORANGE TIMEOUT ST (SAFETY) ×3 IMPLANT
DERMABOND ADVANCED (GAUZE/BANDAGES/DRESSINGS)
DERMABOND ADVANCED .7 DNX12 (GAUZE/BANDAGES/DRESSINGS) IMPLANT
DRSG OPSITE POSTOP 4X10 (GAUZE/BANDAGES/DRESSINGS) ×3 IMPLANT
ELECT REM PT RETURN 9FT ADLT (ELECTROSURGICAL) ×3
ELECTRODE REM PT RTRN 9FT ADLT (ELECTROSURGICAL) ×1 IMPLANT
EXTRACTOR VACUUM KIWI (MISCELLANEOUS) IMPLANT
GLOVE BIO SURGEON STRL SZ 6 (GLOVE) ×3 IMPLANT
GLOVE BIOGEL PI IND STRL 6 (GLOVE) ×2 IMPLANT
GLOVE BIOGEL PI IND STRL 7.0 (GLOVE) ×1 IMPLANT
GLOVE BIOGEL PI INDICATOR 6 (GLOVE) ×4
GLOVE BIOGEL PI INDICATOR 7.0 (GLOVE) ×2
GOWN STRL REUS W/TWL LRG LVL3 (GOWN DISPOSABLE) ×6 IMPLANT
KIT ABG SYR 3ML LUER SLIP (SYRINGE) ×3 IMPLANT
NEEDLE HYPO 25X5/8 SAFETYGLIDE (NEEDLE) ×3 IMPLANT
NS IRRIG 1000ML POUR BTL (IV SOLUTION) ×3 IMPLANT
PACK C SECTION WH (CUSTOM PROCEDURE TRAY) ×3 IMPLANT
PAD OB MATERNITY 4.3X12.25 (PERSONAL CARE ITEMS) ×3 IMPLANT
PENCIL SMOKE EVAC W/HOLSTER (ELECTROSURGICAL) ×3 IMPLANT
STRIP CLOSURE SKIN 1/2X4 (GAUZE/BANDAGES/DRESSINGS) ×2 IMPLANT
SUT CHROMIC 0 CTX 36 (SUTURE) ×9 IMPLANT
SUT MON AB 2-0 CT1 27 (SUTURE) ×3 IMPLANT
SUT PDS AB 0 CT1 27 (SUTURE) IMPLANT
SUT PLAIN 0 NONE (SUTURE) IMPLANT
SUT VIC AB 0 CT1 36 (SUTURE) IMPLANT
SUT VIC AB 4-0 KS 27 (SUTURE) IMPLANT
TOWEL OR 17X24 6PK STRL BLUE (TOWEL DISPOSABLE) ×3 IMPLANT
TRAY FOLEY W/BAG SLVR 14FR LF (SET/KITS/TRAYS/PACK) IMPLANT
WATER STERILE IRR 1000ML POUR (IV SOLUTION) ×6 IMPLANT

## 2020-08-02 NOTE — Op Note (Signed)
Dana Fisher PROCEDURE DATE: 08/02/2020  PREOPERATIVE DIAGNOSIS: Intrauterine pregnancy at  [redacted]w[redacted]d weeks gestation, arrest of dilation  POSTOPERATIVE DIAGNOSIS: The same with OP presentation  PROCEDURE:  Primary Low Transverse Cesarean Section  SURGEON:  Dr. Mitchel Honour  INDICATIONS: Dana Fisher is a 24 y.o. G1P0000 at [redacted]w[redacted]d scheduled for cesarean section secondary to arrest of dilation.  The risks of cesarean section discussed with the patient included but were not limited to: bleeding which may require transfusion or reoperation; infection which may require antibiotics; injury to bowel, bladder, ureters or other surrounding organs; injury to the fetus; need for additional procedures including hysterectomy in the event of a life-threatening hemorrhage; placental abnormalities wth subsequent pregnancies, incisional problems, thromboembolic phenomenon and other postoperative/anesthesia complications. The patient concurred with the proposed plan, giving informed written consent for the procedure.    FINDINGS:  Viable female infant in cephalic (OP) presentation, APGARs 9,9: weight pending  Clear amniotic fluid.  Intact placenta, three vessel cord.  Grossly normal uterus, ovaries and fallopian tubes. .   ANESTHESIA:    Epidural ESTIMATED BLOOD LOSS: 600 ml SPECIMENS: Placenta sent with mother per her request COMPLICATIONS: None immediate  PROCEDURE IN DETAIL:  The patient received intravenous antibiotics and had sequential compression devices applied to her lower extremities while in the preoperative area.  She was then taken to the operating room where epidural anesthesia was dosed up to surgical level and was found to be adequate. She was then placed in a dorsal supine position with a leftward tilt, and prepped and draped in a sterile manner.  A foley catheter was placed into her bladder and attached to constant gravity.  After an adequate timeout was performed, a Pfannenstiel skin incision  was made with scalpel and carried through to the underlying layer of fascia. The fascia was incised in the midline and this incision was extended bilaterally using the Mayo scissors. Kocher clamps were applied to the superior aspect of the fascial incision and the underlying rectus muscles were dissected off bluntly. A similar process was carried out on the inferior aspect of the facial incision. The rectus muscles were separated in the midline bluntly and the peritoneum was entered bluntly.  Bladder flap was created sharply and developed bluntly.  Bladder blade was placed.  A transverse hysterotomy was made with a scalpel and extended bilaterally bluntly. The bladder blade was then removed. The infant was successfully delivered, and cord was clamped and cut and infant was handed over to awaiting neonatology team. Uterine massage was then administered and the placenta delivered intact with three-vessel cord. The uterus was cleared of clot and debris.  The hysterotomy was closed with 0 chromic.  A second imbricating suture of 0-chromic was used to reinforce the incision and aid in hemostasis.  The peritoneum and rectus muscles were noted to be hemostatic and were reapproximated using 3-0 monocryl in a running fashion.  The fascia was closed with 0-Vicryl in a running fashion with good restoration of anatomy.  The subcutaneus tissue was copiously irrigated.  The skin was closed with 4-0 vicryl in a subcuticular fashion.  Pt tolerated the procedure will.  All counts were correct x2.  Pt went to the recovery room in stable condition.

## 2020-08-02 NOTE — Progress Notes (Signed)
DOS  Comfortable in bed  Today's Vitals   08/02/20 0615 08/02/20 0623 08/02/20 0639 08/02/20 0640  BP:  122/84 123/73   Pulse: (!) 139  99   Resp: (!) 25  17   Temp:   99.4 F (37.4 C)   TempSrc:   Oral   SpO2: 100%     Weight:      Height:      PainSc: 0-No pain   0-No pain   Body mass index is 34.28 kg/m.   PAS on A/P: satisfactory progress         Per Orders

## 2020-08-02 NOTE — Anesthesia Postprocedure Evaluation (Signed)
Anesthesia Post Note  Patient: ILZE ROSELLI  Procedure(s) Performed: CESAREAN SECTION (N/A )     Patient location during evaluation: PACU Anesthesia Type: Epidural Level of consciousness: oriented and awake and alert Pain management: pain level controlled Vital Signs Assessment: post-procedure vital signs reviewed and stable Respiratory status: spontaneous breathing, respiratory function stable and nonlabored ventilation Cardiovascular status: blood pressure returned to baseline and stable Postop Assessment: no headache, no backache, no apparent nausea or vomiting, epidural receding and patient able to bend at knees Anesthetic complications: no   No complications documented.  Last Vitals:  Vitals:   08/02/20 0515 08/02/20 0530  BP: 130/73 131/85  Pulse: (!) 127 (!) 118  Resp: 18 (!) 21  Temp: 37.4 C   SpO2: 98% 100%    Last Pain:  Vitals:   08/02/20 0530  TempSrc:   PainSc: 0-No pain   Pain Goal:                Epidural/Spinal Function Cutaneous sensation:  (heavy) (08/02/20 0530), Patient able to flex knees: Yes (08/02/20 0530), Patient able to lift hips off bed: Yes (08/02/20 0530), Back pain beyond tenderness at insertion site: No (08/02/20 0530), Progressively worsening motor and/or sensory loss: No (08/02/20 0530), Bowel and/or bladder incontinence post epidural: No (08/02/20 0530)  Ashanti Ratti A.

## 2020-08-02 NOTE — Progress Notes (Signed)
CVX 6-7/90/-2 with caput.  Given adequate MVUs and no cervical change over >4 hours, I recommend primary C/S for arrest of dilation.  Patient is counseled re: risk of bleeding, infection, scarring, and damage to surrounding structures.  Patient is counseled re: risk in future pregnancy including abnormal placentation and uterine rupture.  All questions were answered and patient wishes to proceed.  Mitchel Honour, DO

## 2020-08-02 NOTE — Transfer of Care (Signed)
Immediate Anesthesia Transfer of Care Note  Patient: Dana Fisher  Procedure(s) Performed: CESAREAN SECTION (N/A )  Patient Location: PACU  Anesthesia Type:Epidural  Level of Consciousness: awake, alert , oriented and patient cooperative  Airway & Oxygen Therapy: Patient Spontanous Breathing  Post-op Assessment: Report given to RN and Post -op Vital signs reviewed and stable  Post vital signs: Reviewed and stable  Last Vitals:  Vitals Value Taken Time  BP 130/73 08/02/20 0516  Temp    Pulse 124 08/02/20 0518  Resp 17 08/02/20 0518  SpO2 99 % 08/02/20 0518  Vitals shown include unvalidated device data.  Last Pain:  Vitals:   08/02/20 0330  TempSrc:   PainSc: 0-No pain         Complications: No complications documented.

## 2020-08-02 NOTE — Lactation Note (Signed)
This note was copied from a baby's chart. 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  Mom attempting to breastfeed on arrival.  Mom reports she keeps cuing but will not latch.  Baby going back and forth between cuing and gagging and then started spitting up.  After a few minutes infdant started cuing and attempted to latch and started spitting up again.  Assist with putting her STS with dad for a few minutes but she got fussy. While she was STS with dad Quickly reviewed understanding mother and baby and Gave cone breastfeeding Consultation services handouts.  Did not ask about pump at this time. Parents seemed distracted with her being spitty. Asking questions about it.   Discussed bulb syringe and keeping her up while doing STS.  Assist with latching her to moms right breast in cradle hold.  Infant latched easily.  Kept reminding mom to keep her in close, chin and cheeks touching breast with mouth open wide and lips flanged.  Mom reports comfort.  Left mom and baby breastfeeding. Urged to call lactation as needed.     Tylar Amborn S Mindi Akerson 08/02/2020, 11:15 PM

## 2020-08-03 LAB — CBC
HCT: 22.6 % — ABNORMAL LOW (ref 36.0–46.0)
Hemoglobin: 7.1 g/dL — ABNORMAL LOW (ref 12.0–15.0)
MCH: 26.5 pg (ref 26.0–34.0)
MCHC: 31.4 g/dL (ref 30.0–36.0)
MCV: 84.3 fL (ref 80.0–100.0)
Platelets: 231 10*3/uL (ref 150–400)
RBC: 2.68 MIL/uL — ABNORMAL LOW (ref 3.87–5.11)
RDW: 14.6 % (ref 11.5–15.5)
WBC: 15.9 10*3/uL — ABNORMAL HIGH (ref 4.0–10.5)
nRBC: 0 % (ref 0.0–0.2)

## 2020-08-03 MED ORDER — RHO D IMMUNE GLOBULIN 1500 UNIT/2ML IJ SOSY
300.0000 ug | PREFILLED_SYRINGE | Freq: Once | INTRAMUSCULAR | Status: AC
  Administered 2020-08-03: 300 ug via INTRAMUSCULAR
  Filled 2020-08-03: qty 2

## 2020-08-03 NOTE — Lactation Note (Signed)
This note was copied from a baby's chart. Lactation Consultation Note  Patient Name: Dana Fisher LOVFI'E Date: 08/03/2020 Reason for consult: Follow-up assessment;Primapara;1st time breastfeeding;Term;Infant weight loss;Nipple pain/trauma Baby is 3 hours old and the [facility] Deven Efird called the LC in the room  To check a latch due to pinching.  LC checked and changed a wet diaper and placed baby STS on the right breast / football / and after reviewed breast massage, hand express, reverse pressure  Assisted to latch baby with breast compressions until increased swallows. Initially per mom some discomfort and improved with depth and compressions and baby fed 13 mins. Nipple well rounded when baby released.  LC recommended due to sore nipples - prior to latch until soreness improves - breast massage , hand express, pre pump with hand pump and reverse pressure . #24 F a good fit.  LC encouraged mom to call for assistance. Mom expressed she is very tired.  LC also provided comfort gels and recommended after feedings without any nipple creams and alternating with shells while awake.   Maternal Data Has patient been taught Hand Expression?: Yes Does the patient have breastfeeding experience prior to this delivery?: No  Feeding Feeding Type: Breast Fed  LATCH Score Latch: Grasps breast easily, tongue down, lips flanged, rhythmical sucking.  Audible Swallowing: Spontaneous and intermittent (increased swallows with breast compressions )  Type of Nipple: Everted at rest and after stimulation  Comfort (Breast/Nipple): Filling, red/small blisters or bruises, mild/mod discomfort  Hold (Positioning): Assistance needed to correctly position infant at breast and maintain latch.  LATCH Score: 8  Interventions Interventions: Breast feeding basics reviewed;Assisted with latch;Skin to skin;Breast massage;Breast compression;Adjust position;Support pillows;Position options;Shells;Comfort gels;Hand  pump  Lactation Tools Discussed/Used Pump Review: Setup, frequency, and cleaning Initiated by:: MAI  Date initiated:: 08/03/20   Consult Status Consult Status: Follow-up Date: 08/04/20 Follow-up type: In-patient    Matilde Sprang Clothilde Tippetts 08/03/2020, 6:23 PM

## 2020-08-03 NOTE — Lactation Note (Signed)
This note was copied from a baby's chart. Lactation Consultation Note  Patient Name: Girl Weslie Rasmus JOITG'P Date: 08/03/2020   Referral from RN. Baby girl Scarlett now 66 hours old.  Mom reports nipples are just to sore right now. Mom has blood blisters on right nipple.  Doesnt think she can breastfeed.  Attempted to assist with latching in laid back breastfeeding position.  Infant latched and does well for a few minutes but comes off and then doesn't get back on comfortable. Did this a few times and mom would break suction and get her off.   Initiated pumping with mom.Mom did well with pumping Able to tolerate well with sore nipples.   Mom got 12 ml of her own milk with pumping.  Praised efforts.  Urged her to pump everytime donor milk given, and Discussed pacifier usage.  Urged parents to d/c pacifier use until infant breastfeeding well.  Washed moms pump parts.  Urged her to do what she could do at next feed.  Gave supplement amount sheet. Prasied breastfeeding efforts.  Mom appreciative.  Maternal Data    Feeding    LATCH Score                   Interventions    Lactation Tools Discussed/Used     Consult Status      Kevyn Wengert Michaelle Copas 08/03/2020, 11:26 PM

## 2020-08-03 NOTE — Clinical Social Work Maternal (Signed)
CLINICAL SOCIAL WORK MATERNAL/CHILD NOTE  Patient Details  Name: Dana Fisher MRN: [id number] Date of Birth: Jan 13, 1996  Date:  08/03/2020  Clinical Social Worker Initiating Note:  Hortencia Pilar, LCSW Date/Time: Initiated:  08/03/20/1000     Child's Name:  Dana Fisher   Biological Parents:  Mother Daphnie Venturini)   Need for Interpreter:  None   Reason for Referral:  Behavioral Health Concerns   Address:  [address] [place] [address]    Phone number:  [phone] (home)     Additional phone number: none   Household Members/Support Persons (HM/SP):   Household Member/Support Person 1   HM/SP Name Relationship DOB or Age  HM/SP -1  Trilby Quam  MOB   24 years old   HM/SP -2  Mark Yarnall  FOB   24 years old   HM/SP -3        HM/SP -4        HM/SP -5        HM/SP -6        HM/SP -7        HM/SP -8          Natural Supports (not living in the home):  Parent   Professional Supports: None   Employment: Unemployed   Type of Work: none at this time.   Education:  Some Materials engineer arranged:  n/a  Surveyor, quantity Resources:  Media planner [facility])   Other Resources:  [facility]   Cultural/Religious Considerations Which May Impact Care:  none   Strengths:  Ability to meet basic needs , Compliance with medical plan , Home prepared for child , Pediatrician chosen   Psychotropic Medications:      None reported.   Pediatrician:    [place]  Pediatrician List:   [facility]    [place]    [facility]      Pediatrician Fax Number:    Risk Factors/Current Problems:  None   Cognitive State:  Insightful , Able to Concentrate , Alert    Mood/Affect:  Relaxed , Comfortable , Calm , Interested , Happy    CSW Assessment: CSW consulted as MOB has hx of anxiety and Bipolar. CSW went to speak with MOB at bedside to address further  needs.   CSW congratulated MOB on the birth of infant. CSW advised MOB of the HIPPA policy in which MOB reported that it was fine for her mother to remain in the room while CSW spoke with her. CSW understanding and advised MOB of CSW's role and the reason for CSW coming to speak with her. MOB reported that she was diagnosed with anxiety at the age of 24. MOB also reported that she was given the diagnosis of Bipolar at this age as well. as well. MOB reported that she has been on medications in the past however reported that she isn't on anything at this time. MOB reported that her signs and symptom shave been managed well on her own MOB reported that she did participate in therapy as a child but nothing recently. MOB denies the need for therapy resources and reported that she has been feeling fine since giving birth. MOB denies SI and HI as well as DV.   MOB reported that she has no other mental health needs. MOB also expressed that she has all needed items to care for infant with follow up care at  [facility]. MOB expressed no other needs.CSW took time to provide MOB with PPD and SIDS education. MOB was given PPD Checklist to keep track of feelings as they relate to PPD. MOB thanked CSW and reported no other needs.   CSW Plan/Description:  No Further Intervention Required/No Barriers to Discharge, Sudden Infant Death Syndrome (SIDS) Education, Perinatal Mood and Anxiety Disorder (PMADs) Education    Robb Matar, LCSWA 08/03/2020, 11:42 AM

## 2020-08-03 NOTE — Progress Notes (Signed)
Subjective: Postpartum Day 1: Cesarean Delivery Patient reports incisional pain, tolerating PO and no problems voiding.    Objective: Vital signs in last 24 hours: Temp:  [97.7 F (36.5 C)-98.2 F (36.8 C)] 97.7 F (36.5 C) (08/26 0517) Pulse Rate:  [71-80] 80 (08/26 0517) Resp:  [16-20] 16 (08/26 0517) BP: (105-124)/(69-89) 105/69 (08/26 0517) SpO2:  [96 %-100 %] 100 % (08/26 0517)  Physical Exam:  General: alert, cooperative, appears stated age and no distress Lochia: appropriate Uterine Fundus: firm Incision: healing well DVT Evaluation: No evidence of DVT seen on physical exam.  Recent Labs    08/01/20 0023 08/03/20 0509  HGB 9.7* 7.1*  HCT 30.9* 22.6*    Assessment/Plan: Status post Cesarean section. Doing well postoperatively.  Continue current care.  Dana Fisher 08/03/2020, 8:14 AM

## 2020-08-04 ENCOUNTER — Inpatient Hospital Stay (HOSPITAL_COMMUNITY): Payer: 59

## 2020-08-04 LAB — RH IG WORKUP (INCLUDES ABO/RH)
ABO/RH(D): O NEG
Fetal Screen: NEGATIVE
Gestational Age(Wks): 39.2
Unit division: 0

## 2020-08-04 MED ORDER — OXYCODONE-ACETAMINOPHEN 5-325 MG PO TABS
1.0000 | ORAL_TABLET | ORAL | 0 refills | Status: DC | PRN
Start: 2020-08-04 — End: 2020-08-09

## 2020-08-04 NOTE — Lactation Note (Addendum)
This note was copied from a baby's chart. Lactation Consultation Note  Patient Name: Dana Fisher ATFTD'D Date: 08/04/2020 Reason for consult: Follow-up assessment;Term;1st time breastfeeding;Primapara;Infant weight loss;Other (Comment) (9 lbs 4.7 oz) Infant being monitored for bilirubin. Mom had some nipple trauma.   Infant is 64 hours old 39 weeks 8% weight loss.  Mom states she was very tired throughout the night. She had some nipple trauma and used the coconut oil throughout the night. She states she did not use the comfort gels since she did not have a bra. I offered to make her a mesh panty bra but she states she has a tube top that she will try and use some bras that she has.   Mom stated infant ate well throughout the night into the morning. Mom did finger feeding with donor breast milk and a curve tip syringe. In the am, she switched to a paced bottle feeding of both her EBM with supplementation with DBM. Mom has been pumping q 2 hrs on maintenance until 8 am this morning. I let her know she should decrease the frequency to every 3 hours for at least 15 minutes.   LC examined breasts and noted abrasion on the nipples. I reviewed with her how to use the comfort gels with information on how to clean and discard them after 6 days. LC noted some swelling with current flange size of 27 and increased her to 30. LC did some massage to reverse some of the swelling around the areola before attempting a latch.   Consult interrupted with blood draw.  LC to return to assist Mom with the latch.    LC returned to assist Mom 12:00 pm with latch on the left side in football. Mom had some discomfort until we adjusted to get a deeper latch. Infant had audible swallows and signs of milk transfer. Infant nursed for 11 minutes and was still nursing after I left.   LC cleaned pump parts. Reviewed signs, treatment and prevention of engorgement.   Mom d/c pending on blood draw done to check the status of  the bilirubin.  Plan 1. To feed 8-12 x 24 hour period, to pump q 3 hours after feeds with 30 flange          2. To express breast milk on nipples for healing, using comfort gels for no more than 6 days separate from the coconut oil. Mom is using the coconut oil on the flanges as well as on her nipples          3. Mom has DEBP at home. She plans to f/u with Pediatrician with [facility] on site tomorrow if d/c home today.     Interventions Interventions: Breast feeding basics reviewed;Coconut oil;Breast massage;Hand express;Comfort gels;Reverse pressure;DEBP;Adjust position;Support pillows  Lactation Tools Discussed/Used Tools: Flanges;Coconut oil;Comfort gels Flange Size: 27   Consult Status Consult Status: Follow-up Date: 08/05/20 Follow-up type: In-patient    Yohannes Waibel  Nicholson-Springer 08/04/2020, 11:48 AM

## 2020-08-04 NOTE — Lactation Note (Signed)
This note was copied from a baby's chart. Lactation Consultation Note  Patient Name: Dana Fisher OOILN'Z Date: 08/04/2020 Reason for consult: Follow-up assessment;Primapara;1st time breastfeeding;Term;Infant weight loss;Other (Comment) (8 % weight loss/ mom in the shower wiull check back)  Baby is 95 hours old    Maternal Data    Feeding Feeding Type: Donor Breast Milk Nipple Type: Slow - flow  LATCH Score                   Interventions    Lactation Tools Discussed/Used     Consult Status Consult Status: Follow-up Date: 08/04/20 Follow-up type: In-patient    Dana Fisher 08/04/2020, 10:19 AM

## 2020-08-04 NOTE — Discharge Summary (Signed)
Postpartum Discharge Summary  Date of Service August 04, 2020     Patient Name: Dana Fisher DOB: Mar 03, 1996 MRN: [id number]  Date of admission: 08/01/2020 Delivery date:08/02/2020  Delivering provider: Linda Hedges  Date of discharge: 08/04/2020  Admitting diagnosis: Pregnancy [Z34.90] Cesarean delivery delivered [O82] Intrauterine pregnancy: [redacted]w[redacted]d    Secondary diagnosis:  Active Problems:   Pregnancy   Cesarean delivery delivered  Additional problems: none    Discharge diagnosis: Term Pregnancy Delivered                                              Post partum procedures:none Augmentation: AROM, Pitocin and Cytotec Complications: None  Hospital course: Induction of Labor With Cesarean Section   24y.o. yo G1P1001 at 363w2das admitted to the hospital 08/01/2020 for induction of labor. Patient had a labor course significant for failure to progress. The patient went for cesarean section due to Arrest of Descent. Delivery details are as follows: Membrane Rupture Time/Date: 12:37 PM ,08/01/2020   Delivery Method:C-Section, Low Transverse  Details of operation can be found in separate operative Note.  Patient had an uncomplicated postpartum course. She is ambulating, tolerating a regular diet, passing flatus, and urinating well.  Patient is discharged home in stable condition on 08/04/20.      Newborn Data: Birth date:08/02/2020  Birth time:4:36 AM  Gender:Female  Living status:Living  Apgars:9 ,9  Weight:4215 g                                 Magnesium Sulfate received: No BMZ received: No Rhophylac:No MMR:No T-DaP:Given prenatally Flu: No Transfusion:No  Physical exam  Vitals:   08/03/20 0517 08/03/20 1411 08/03/20 2213 08/04/20 0550  BP: 105/69 115/81 122/85 115/79  Pulse: 80 78 84   Resp: 16 17  18   Temp: 97.7 F (36.5 C) (!) 96.6 F (35.9 C) 98.9 F (37.2 C) 98.2 F (36.8 C)  TempSrc: Oral Axillary  Oral  SpO2: 100%  100% 100%  Weight:      Height:        General: alert, cooperative and no distress Lochia: appropriate Uterine Fundus: firm Incision: Healing well with no significant drainage DVT Evaluation: No evidence of DVT seen on physical exam. Labs: Lab Results  Component Value Date   WBC 15.9 (H) 08/03/2020   HGB 7.1 (L) 08/03/2020   HCT 22.6 (L) 08/03/2020   MCV 84.3 08/03/2020   PLT 231 08/03/2020   CMP Latest Ref Rng & Units 08/01/2020  Glucose 70 - 99 mg/dL 85  BUN 6 - 20 mg/dL 7  Creatinine 0.44 - 1.00 mg/dL 0.57  Sodium 135 - 145 mmol/L 135  Potassium 3.5 - 5.1 mmol/L 4.2  Chloride 98 - 111 mmol/L 105  CO2 22 - 32 mmol/L 20(L)  Calcium 8.9 - 10.3 mg/dL 9.6  Total Protein 6.5 - 8.1 g/dL 5.8(L)  Total Bilirubin 0.3 - 1.2 mg/dL 0.5  Alkaline Phos 38 - 126 U/L 169(H)  AST 15 - 41 U/L 31  ALT 0 - 44 U/L 25   Edinburgh Score: Edinburgh Postnatal Depression Scale Screening Tool 08/02/2020  I have been able to laugh and see the funny side of things. 0  I have looked forward with enjoyment to things. 0  I have blamed myself  unnecessarily when things went wrong. 1  I have been anxious or worried for no good reason. 1  I have felt scared or panicky for no good reason. 1  Things have been getting on top of me. 0  I have been so unhappy that I have had difficulty sleeping. 0  I have felt sad or miserable. 0  I have been so unhappy that I have been crying. 0  The thought of harming myself has occurred to me. 0  Edinburgh Postnatal Depression Scale Total 3      After visit meds:  Allergies as of 08/04/2020   No Known Allergies     Medication List    TAKE these medications   levothyroxine 50 MCG tablet Commonly known as: SYNTHROID Take 50 mcg by mouth daily.   oxyCODONE-acetaminophen 5-325 MG tablet Commonly known as: PERCOCET/ROXICET Take 1-2 tablets by mouth every 4 (four) hours as needed for moderate pain.   prenatal multivitamin Tabs tablet Take 1 tablet by mouth daily at 12 noon.         Discharge home in stable condition Infant Feeding: Breast Infant Disposition:home with mother Discharge instruction: per After Visit Summary and Postpartum booklet. Activity: Advance as tolerated. Pelvic rest for 6 weeks.  Diet: routine diet Anticipated Birth Control: Unsure Postpartum Appointment:1 week Additional Postpartum F/U: none Future Appointments:No future appointments. Follow up Visit:      08/04/2020 Cyril Mourning, MD

## 2020-08-08 ENCOUNTER — Inpatient Hospital Stay (HOSPITAL_BASED_OUTPATIENT_CLINIC_OR_DEPARTMENT_OTHER): Payer: 59

## 2020-08-08 ENCOUNTER — Inpatient Hospital Stay (HOSPITAL_COMMUNITY)
Admission: AD | Admit: 2020-08-08 | Discharge: 2020-08-08 | Disposition: A | Discharge status: Home / Self Care | Payer: 59 | Attending: Obstetrics & Gynecology | Admitting: Obstetrics & Gynecology

## 2020-08-08 DIAGNOSIS — R609 Edema, unspecified: Secondary | ICD-10-CM

## 2020-08-08 DIAGNOSIS — O1205 Gestational edema, complicating the puerperium: Secondary | ICD-10-CM | POA: Diagnosis not present

## 2020-08-08 DIAGNOSIS — O165 Unspecified maternal hypertension, complicating the puerperium: Secondary | ICD-10-CM | POA: Diagnosis not present

## 2020-08-08 DIAGNOSIS — R52 Pain, unspecified: Secondary | ICD-10-CM

## 2020-08-08 DIAGNOSIS — Z87891 Personal history of nicotine dependence: Secondary | ICD-10-CM | POA: Insufficient documentation

## 2020-08-08 DIAGNOSIS — O9081 Anemia of the puerperium: Secondary | ICD-10-CM | POA: Diagnosis not present

## 2020-08-08 LAB — COMPREHENSIVE METABOLIC PANEL
ALT: 28 U/L (ref 0–44)
AST: 25 U/L (ref 15–41)
Albumin: 2.5 g/dL — ABNORMAL LOW (ref 3.5–5.0)
Alkaline Phosphatase: 141 U/L — ABNORMAL HIGH (ref 38–126)
Anion gap: 11 (ref 5–15)
BUN: 16 mg/dL (ref 6–20)
CO2: 21 mmol/L — ABNORMAL LOW (ref 22–32)
Calcium: 8.5 mg/dL — ABNORMAL LOW (ref 8.9–10.3)
Chloride: 110 mmol/L (ref 98–111)
Creatinine, Ser: 0.67 mg/dL (ref 0.44–1.00)
GFR calc Af Amer: >60 mL/min (ref >60)
GFR calc non Af Amer: >60 mL/min (ref >60)
Glucose, Bld: 81 mg/dL (ref 70–99)
Potassium: 4 mmol/L (ref 3.5–5.1)
Sodium: 142 mmol/L (ref 135–145)
Total Bilirubin: 0.5 mg/dL (ref 0.3–1.2)
Total Protein: 5.4 g/dL — ABNORMAL LOW (ref 6.5–8.1)

## 2020-08-08 LAB — CBC
HCT: 25.8 % — ABNORMAL LOW (ref 36.0–46.0)
Hemoglobin: 7.7 g/dL — ABNORMAL LOW (ref 12.0–15.0)
MCH: 25.8 pg — ABNORMAL LOW (ref 26.0–34.0)
MCHC: 29.8 g/dL — ABNORMAL LOW (ref 30.0–36.0)
MCV: 86.3 fL (ref 80.0–100.0)
Platelets: 361 10*3/uL (ref 150–400)
RBC: 2.99 MIL/uL — ABNORMAL LOW (ref 3.87–5.11)
RDW: 14.5 % (ref 11.5–15.5)
WBC: 10.7 10*3/uL — ABNORMAL HIGH (ref 4.0–10.5)
nRBC: 0.3 % — ABNORMAL HIGH (ref 0.0–0.2)

## 2020-08-08 LAB — URINALYSIS, ROUTINE W REFLEX MICROSCOPIC
Bacteria, UA: NONE SEEN
Bilirubin Urine: NEGATIVE
Glucose, UA: NEGATIVE mg/dL
Ketones, ur: NEGATIVE mg/dL
Leukocytes,Ua: NEGATIVE
Nitrite: NEGATIVE
Protein, ur: NEGATIVE mg/dL
Specific Gravity, Urine: 1.005 (ref 1.005–1.030)
pH: 6 (ref 5.0–8.0)

## 2020-08-08 MED ORDER — METOCLOPRAMIDE HCL 5 MG/ML IJ SOLN
10.0000 mg | Freq: Once | INTRAMUSCULAR | Status: AC
  Administered 2020-08-08: 10 mg via INTRAVENOUS
  Filled 2020-08-08: qty 2

## 2020-08-08 MED ORDER — DIPHENHYDRAMINE HCL 50 MG/ML IJ SOLN
25.0000 mg | Freq: Once | INTRAMUSCULAR | Status: AC
  Administered 2020-08-08: 25 mg via INTRAVENOUS
  Filled 2020-08-08: qty 1

## 2020-08-08 MED ORDER — DEXAMETHASONE SODIUM PHOSPHATE 10 MG/ML IJ SOLN
10.0000 mg | Freq: Once | INTRAMUSCULAR | Status: AC
  Administered 2020-08-08: 10 mg via INTRAVENOUS
  Filled 2020-08-08: qty 1

## 2020-08-08 MED ORDER — LACTATED RINGERS IV SOLN: Freq: Once | INTRAVENOUS | Status: AC

## 2020-08-08 MED ORDER — AMLODIPINE BESYLATE 5 MG PO TABS
5.0000 mg | ORAL_TABLET | Freq: Every day | ORAL | Status: DC
  Administered 2020-08-08: 5 mg via ORAL
  Filled 2020-08-08: qty 1

## 2020-08-08 MED ORDER — AMLODIPINE BESYLATE 5 MG PO TABS: 5.0000 mg | ORAL_TABLET | Freq: Every day | ORAL | 0 refills | Status: DC

## 2020-08-08 NOTE — MAU Note (Signed)
.   Dana Fisher is a 24 y.o. here in MAU reporting: she had an appointment with Dr Langston Masker today and was told to come in and be evaluated. She has been having tightness and swelling in her feet and ankles. Reports mild headache with pressure in her chest  Onset of complaint: couple of days Pain score: 5 Vitals:   08/08/20 1657  BP: (!) 145/96  Pulse: 64  Resp: 18  Temp: 98.7 F (37.1 C)     Lab orders placed from triage: UA
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
## 2020-08-08 NOTE — Discharge Instructions (Signed)
Postpartum Hypertension Postpartum hypertension is high blood pressure that remains higher than normal after childbirth. You may not realize that you have postpartum hypertension if your blood pressure is not being checked regularly. In most cases, postpartum hypertension will go away on its own, usually within a week of delivery. However, for some women, medical treatment is required to prevent serious complications, such as seizures or stroke. What are the causes? This condition may be caused by one or more of the following:  Hypertension that existed before pregnancy (chronic hypertension).  Hypertension that comes on as a result of pregnancy (gestational hypertension).  Hypertensive disorders during pregnancy (preeclampsia) or seizures in women who have high blood pressure during pregnancy (eclampsia).  A condition in which the liver, platelets, and red blood cells are damaged during pregnancy (HELLP syndrome).  A condition in which the thyroid produces too much hormones (hyperthyroidism).  Other rare problems of the nerves (neurological disorders) or blood disorders. In some cases, the cause may not be known. What increases the risk? The following factors may make you more likely to develop this condition:  Chronic hypertension. In some cases, this may not have been diagnosed before pregnancy.  Obesity.  Type 2 diabetes.  Kidney disease.  History of preeclampsia or eclampsia.  Other medical conditions that change the level of hormones in the body (hormonal imbalance). What are the signs or symptoms? As with all types of hypertension, postpartum hypertension may not have any symptoms. Depending on how high your blood pressure is, you may experience:  Headaches. These may be mild, moderate, or severe. They may also be steady, constant, or sudden in onset (thunderclap headache).  Changes in your ability to see (visual changes).  Dizziness.  Shortness of breath.  Swelling  of your hands, feet, lower legs, or face. In some cases, you may have swelling in more than one of these locations.  Heart palpitations or a racing heartbeat.  Difficulty breathing while lying down.  Decrease in the amount of urine that you pass. Other rare signs and symptoms may include:  Sweating more than usual. This lasts longer than a few days after delivery.  Chest pain.  Sudden dizziness when you get up from sitting or lying down.  Seizures.  Nausea or vomiting.  Abdominal pain. How is this diagnosed? This condition may be diagnosed based on the results of a physical exam, blood pressure measurements, and blood and urine tests. You may also have other tests, such as a CT scan or an MRI, to check for other problems of postpartum hypertension. How is this treated? If blood pressure is high enough to require treatment, your options may include:  Medicines to reduce blood pressure (antihypertensives). Tell your health care provider if you are breastfeeding or if you plan to breastfeed. There are many antihypertensive medicines that are safe to take while breastfeeding.  Stopping medicines that may be causing hypertension.  Treating medical conditions that are causing hypertension.  Treating the complications of hypertension, such as seizures, stroke, or kidney problems. Your health care provider will also continue to monitor your blood pressure closely until it is within a safe range for you. Follow these instructions at home:  Take over-the-counter and prescription medicines only as told by your health care provider.  Return to your normal activities as told by your health care provider. Ask your health care provider what activities are safe for you.  Do not use any products that contain nicotine or tobacco, such as cigarettes and e-cigarettes. If   you need help quitting, ask your health care provider.  Keep all follow-up visits as told by your health care provider. This  is important. Contact a health care provider if:  Your symptoms get worse.  You have new symptoms, such as: ? A headache that does not get better. ? Dizziness. ? Visual changes. Get help right away if:  You suddenly develop swelling in your hands, ankles, or face.  You have sudden, rapid weight gain.  You develop difficulty breathing, chest pain, racing heartbeat, or heart palpitations.  You develop severe pain in your abdomen.  You have any symptoms of a stroke. "BE FAST" is an easy way to remember the main warning signs of a stroke: ? B - Balance. Signs are dizziness, sudden trouble walking, or loss of balance. ? E - Eyes. Signs are trouble seeing or a sudden change in vision. ? F - Face. Signs are sudden weakness or numbness of the face, or the face or eyelid drooping on one side. ? A - Arms. Signs are weakness or numbness in an arm. This happens suddenly and usually on one side of the body. ? S - Speech. Signs are sudden trouble speaking, slurred speech, or trouble understanding what people say. ? T - Time. Time to call emergency services. Write down what time symptoms started.  You have other signs of a stroke, such as: ? A sudden, severe headache with no known cause. ? Nausea or vomiting. ? Seizure. These symptoms may represent a serious problem that is an emergency. Do not wait to see if the symptoms will go away. Get medical help right away. Call your local emergency services (911 in the [place] Do not drive yourself to the hospital. Summary  Postpartum hypertension is high blood pressure that remains higher than normal after childbirth.  In most cases, postpartum hypertension will go away on its own, usually within a week of delivery.  For some women, medical treatment is required to prevent serious complications, such as seizures or stroke. This information is not intended to replace advice given to you by your health care provider. Make sure you discuss any questions  you have with your health care provider. Document Revised: 01/01/2019 Document Reviewed: 09/15/2017 Elsevier Patient Education  2020 [employer]  

## 2020-08-08 NOTE — Progress Notes (Signed)
Bilateral lower extremity venous duplex complete.  Preliminary results given to [facility] @ 19:00. Please see CV proc tab for preliminary results. Levin Bacon- RDMS, RVT 7:12 PM  08/08/2020

## 2020-08-09 ENCOUNTER — Other Ambulatory Visit: Payer: Self-pay

## 2020-08-09 ENCOUNTER — Inpatient Hospital Stay (HOSPITAL_COMMUNITY)
Admission: AD | Admit: 2020-08-09 | Discharge: 2020-08-09 | Disposition: A | Discharge status: Home / Self Care | Payer: 59 | Attending: Obstetrics and Gynecology | Admitting: Obstetrics and Gynecology

## 2020-08-09 ENCOUNTER — Encounter (HOSPITAL_COMMUNITY): Payer: Self-pay | Admitting: Obstetrics and Gynecology

## 2020-08-09 DIAGNOSIS — Z87891 Personal history of nicotine dependence: Secondary | ICD-10-CM | POA: Insufficient documentation

## 2020-08-09 DIAGNOSIS — O165 Unspecified maternal hypertension, complicating the puerperium: Secondary | ICD-10-CM

## 2020-08-09 DIAGNOSIS — O9081 Anemia of the puerperium: Secondary | ICD-10-CM | POA: Insufficient documentation

## 2020-08-09 DIAGNOSIS — R519 Headache, unspecified: Secondary | ICD-10-CM | POA: Diagnosis present

## 2020-08-09 DIAGNOSIS — G44209 Tension-type headache, unspecified, not intractable: Secondary | ICD-10-CM

## 2020-08-09 LAB — COMPREHENSIVE METABOLIC PANEL
ALT: 26 U/L (ref 0–44)
AST: 25 U/L (ref 15–41)
Albumin: 2.6 g/dL — ABNORMAL LOW (ref 3.5–5.0)
Alkaline Phosphatase: 133 U/L — ABNORMAL HIGH (ref 38–126)
Anion gap: 11 (ref 5–15)
BUN: 15 mg/dL (ref 6–20)
CO2: 21 mmol/L — ABNORMAL LOW (ref 22–32)
Calcium: 8.5 mg/dL — ABNORMAL LOW (ref 8.9–10.3)
Chloride: 111 mmol/L (ref 98–111)
Creatinine, Ser: 0.74 mg/dL (ref 0.44–1.00)
GFR calc Af Amer: >60 mL/min (ref >60)
GFR calc non Af Amer: >60 mL/min (ref >60)
Glucose, Bld: 94 mg/dL (ref 70–99)
Potassium: 3.5 mmol/L (ref 3.5–5.1)
Sodium: 143 mmol/L (ref 135–145)
Total Bilirubin: 0.4 mg/dL (ref 0.3–1.2)
Total Protein: 5.6 g/dL — ABNORMAL LOW (ref 6.5–8.1)

## 2020-08-09 LAB — DIFFERENTIAL
Abs Immature Granulocytes: 0.49 10*3/uL — ABNORMAL HIGH (ref 0.00–0.07)
Basophils Absolute: 0 10*3/uL (ref 0.0–0.1)
Basophils Relative: 0 %
Eosinophils Absolute: 0 10*3/uL (ref 0.0–0.5)
Eosinophils Relative: 0 %
Immature Granulocytes: 4 %
Lymphocytes Relative: 20 %
Lymphs Abs: 2.4 10*3/uL (ref 0.7–4.0)
Monocytes Absolute: 1.1 10*3/uL — ABNORMAL HIGH (ref 0.1–1.0)
Monocytes Relative: 9 %
Neutro Abs: 8.3 10*3/uL — ABNORMAL HIGH (ref 1.7–7.7)
Neutrophils Relative %: 67 %

## 2020-08-09 LAB — URINALYSIS, ROUTINE W REFLEX MICROSCOPIC
Bilirubin Urine: NEGATIVE
Glucose, UA: NEGATIVE mg/dL
Ketones, ur: NEGATIVE mg/dL
Nitrite: NEGATIVE
Protein, ur: NEGATIVE mg/dL
Specific Gravity, Urine: 1.016 (ref 1.005–1.030)
pH: 6 (ref 5.0–8.0)

## 2020-08-09 LAB — CBC
HCT: 26.5 % — ABNORMAL LOW (ref 36.0–46.0)
Hemoglobin: 7.9 g/dL — ABNORMAL LOW (ref 12.0–15.0)
MCH: 25.6 pg — ABNORMAL LOW (ref 26.0–34.0)
MCHC: 29.8 g/dL — ABNORMAL LOW (ref 30.0–36.0)
MCV: 85.8 fL (ref 80.0–100.0)
Platelets: 374 10*3/uL (ref 150–400)
RBC: 3.09 MIL/uL — ABNORMAL LOW (ref 3.87–5.11)
RDW: 14.6 % (ref 11.5–15.5)
WBC: 12.3 10*3/uL — ABNORMAL HIGH (ref 4.0–10.5)
nRBC: 0.2 % (ref 0.0–0.2)

## 2020-08-09 LAB — PROTEIN / CREATININE RATIO, URINE
Creatinine, Urine: 131.95 mg/dL
Protein Creatinine Ratio: 0.11 mg/mg{Cre} (ref 0.00–0.15)
Total Protein, Urine: 14 mg/dL

## 2020-08-09 MED ORDER — SENNOSIDES-DOCUSATE SODIUM 8.6-50 MG PO TABS: 1.0000 | ORAL_TABLET | Freq: Every day | ORAL | 0 refills | Status: DC

## 2020-08-09 MED ORDER — BUTALBITAL-APAP-CAFFEINE 50-325-40 MG PO TABS: 1.0000 | ORAL_TABLET | Freq: Four times a day (QID) | ORAL | 0 refills | Status: AC | PRN

## 2020-08-09 MED ORDER — HYDROCHLOROTHIAZIDE 25 MG PO TABS
25.0000 mg | ORAL_TABLET | Freq: Once | ORAL | Status: AC
  Administered 2020-08-09: 25 mg via ORAL
  Filled 2020-08-09 (×2): qty 1

## 2020-08-09 MED ORDER — BUTALBITAL-APAP-CAFFEINE 50-325-40 MG PO TABS
2.0000 | ORAL_TABLET | Freq: Once | ORAL | Status: AC
  Administered 2020-08-09: 2 via ORAL
  Filled 2020-08-09: qty 2

## 2020-08-09 MED ORDER — LABETALOL HCL 5 MG/ML IV SOLN: 20.0000 mg | INTRAVENOUS | Status: DC | PRN

## 2020-08-09 MED ORDER — SODIUM CHLORIDE 0.9 % IV SOLN: INTRAVENOUS | Status: DC

## 2020-08-09 MED ORDER — LABETALOL HCL 5 MG/ML IV SOLN: 80.0000 mg | INTRAVENOUS | Status: DC | PRN

## 2020-08-09 MED ORDER — HYDRALAZINE HCL 20 MG/ML IJ SOLN: 10.0000 mg | INTRAMUSCULAR | Status: DC | PRN

## 2020-08-09 MED ORDER — SODIUM CHLORIDE 0.9 % IV SOLN
510.0000 mg | Freq: Once | INTRAVENOUS | Status: AC
  Administered 2020-08-09: 510 mg via INTRAVENOUS
  Filled 2020-08-09: qty 17

## 2020-08-09 MED ORDER — LABETALOL HCL 5 MG/ML IV SOLN: 40.0000 mg | INTRAVENOUS | Status: DC | PRN

## 2020-08-09 MED ORDER — HYDROCHLOROTHIAZIDE 25 MG PO TABS: 25.0000 mg | ORAL_TABLET | Freq: Every day | ORAL | 0 refills | Status: DC

## 2020-08-09 MED ORDER — AMLODIPINE BESYLATE 5 MG PO TABS
5.0000 mg | ORAL_TABLET | Freq: Once | ORAL | Status: AC
  Administered 2020-08-09: 5 mg via ORAL
  Filled 2020-08-09: qty 1

## 2020-08-09 MED ORDER — AMLODIPINE BESYLATE 10 MG PO TABS: 10.0000 mg | ORAL_TABLET | Freq: Every day | ORAL | 1 refills | Status: DC

## 2020-08-09 NOTE — MAU Note (Signed)
Sent from MD office for BP evaluation.  Seen in MAU yesterday for same.  Pt currently reports H/A and visual disturbances.  Denies epigastric pain.  S/P Cesarean 08/02/2020.

## 2020-08-09 NOTE — Discharge Instructions (Signed)
Postpartum Care After Cesarean Delivery This sheet gives you information about how to care for yourself from the time you deliver your baby to up to 6-12 weeks after delivery (postpartum period). Your health care provider may also give you more specific instructions. If you have problems or questions, contact your health care provider. Follow these instructions at home: Medicines  Take over-the-counter and prescription medicines only as told by your health care provider.  If you were prescribed an antibiotic medicine, take it as told by your health care provider. Do not stop taking the antibiotic even if you start to feel better.  Ask your health care provider if the medicine prescribed to you: ? Requires you to avoid driving or using heavy machinery. ? Can cause constipation. You may need to take actions to prevent or treat constipation, such as:  Drink enough fluid to keep your urine pale yellow.  Take over-the-counter or prescription medicines.  Eat foods that are high in fiber, such as beans, whole grains, and fresh fruits and vegetables.  Limit foods that are high in fat and processed sugars, such as fried or sweet foods. Activity  Gradually return to your normal activities as told by your health care provider.  Avoid activities that take a lot of effort and energy (are strenuous) until approved by your health care provider. Walking at a slow to moderate pace is usually safe. Ask your health care provider what activities are safe for you. ? Do not lift anything that is heavier than your baby or 10 lb (4.5 kg) as told by your health care provider. ? Do not vacuum, climb stairs, or drive a car for as long as told by your health care provider.  If possible, have someone help you at home until you are able to do your usual activities yourself.  Rest as much as possible. Try to rest or take naps while your baby is sleeping. Vaginal bleeding  It is normal to have vaginal bleeding  (lochia) after delivery. Wear a sanitary pad to absorb vaginal bleeding and discharge. ? During the first week after delivery, the amount and appearance of lochia is often similar to a menstrual period. ? Over the next few weeks, it will gradually decrease to a dry, yellow-brown discharge. ? For most women, lochia stops completely by 4-6 weeks after delivery. Vaginal bleeding can vary from woman to woman.  Change your sanitary pads frequently. Watch for any changes in your flow, such as: ? A sudden increase in volume. ? A change in color. ? Large blood clots.  If you pass a blood clot, save it and call your health care provider to discuss. Do not flush blood clots down the toilet before you get instructions from your health care provider.  Do not use tampons or douches until your health care provider says this is safe.  If you are not breastfeeding, your period should return 6-8 weeks after delivery. If you are breastfeeding, your period may return anytime between 8 weeks after delivery and the time that you stop breastfeeding. Perineal care   If your C-section (Cesarean section) was unplanned, and you were allowed to labor and push before delivery, you may have pain, swelling, and discomfort of the tissue between your vaginal opening and your anus (perineum). You may also have an incision in the tissue (episiotomy) or the tissue may have torn during delivery. Follow these instructions as told by your health care provider: ? Keep your perineum clean and dry as told by   your health care provider. Use medicated pads and pain-relieving sprays and creams as directed. ? If you have an episiotomy or vaginal tear, check the area every day for signs of infection. Check for:  Redness, swelling, or pain.  Fluid or blood.  Warmth.  Pus or a bad smell. ? You may be given a squirt bottle to use instead of wiping to clean the perineum area after you go to the bathroom. As you start healing, you may use  the squirt bottle before wiping yourself. Make sure to wipe gently. ? To relieve pain caused by an episiotomy, vaginal tear, or hemorrhoids, try taking a warm sitz bath 2-3 times a day. A sitz bath is a warm water bath that is taken while you are sitting down. The water should only come up to your hips and should cover your buttocks. Breast care  Within the first few days after delivery, your breasts may feel heavy, full, and uncomfortable (breast engorgement). You may also have milk leaking from your breasts. Your health care provider can suggest ways to help relieve breast discomfort. Breast engorgement should go away within a few days.  If you are breastfeeding: ? Wear a bra that supports your breasts and fits you well. ? Keep your nipples clean and dry. Apply creams and ointments as told by your health care provider. ? You may need to use breast pads to absorb milk leakage. ? You may have uterine contractions every time you breastfeed for several weeks after delivery. Uterine contractions help your uterus return to its normal size. ? If you have any problems with breastfeeding, work with your health care provider or a lactation consultant.  If you are not breastfeeding: ? Avoid touching your breasts as this can make your breasts produce more milk. ? Wear a well-fitting bra and use cold packs to help with swelling. ? Do not squeeze out (express) milk. This causes you to make more milk. Intimacy and sexuality  Ask your health care provider when you can engage in sexual activity. This may depend on your: ? Risk of infection. ? Healing rate. ? Comfort and desire to engage in sexual activity.  You are able to get pregnant after delivery, even if you have not had your period. If desired, talk with your health care provider about methods of family planning or birth control (contraception). Lifestyle  Do not use any products that contain nicotine or tobacco, such as cigarettes, e-cigarettes,  and chewing tobacco. If you need help quitting, ask your health care provider.  Do not drink alcohol, especially if you are breastfeeding. Eating and drinking   Drink enough fluid to keep your urine pale yellow.  Eat high-fiber foods every day. These may help prevent or relieve constipation. High-fiber foods include: ? Whole grain cereals and breads. ? Brown rice. ? Beans. ? Fresh fruits and vegetables.  Take your prenatal vitamins until your postpartum checkup or until your health care provider tells you it is okay to stop. General instructions  Keep all follow-up visits for you and your baby as told by your health care provider. Most women visit their health care provider for a postpartum checkup within the first 3-6 weeks after delivery. Contact a health care provider if you:  Feel unable to cope with the changes that a new baby brings to your life, and these feelings do not go away.  Feel unusually sad or worried.  Have breasts that are painful, hard, or turn red.  Have a fever.    Have trouble holding urine or keeping urine from leaking.  Have little or no interest in activities you used to enjoy.  Have not breastfed at all and you have not had a menstrual period for 12 weeks after delivery.  Have stopped breastfeeding and you have not had a menstrual period for 12 weeks after you stopped breastfeeding.  Have questions about caring for yourself or your baby.  Pass a blood clot from your vagina. Get help right away if you:  Have chest pain.  Have difficulty breathing.  Have sudden, severe leg pain.  Have severe pain or cramping in your abdomen.  Bleed from your vagina so much that you fill more than one sanitary pad in one hour. Bleeding should not be heavier than your heaviest period.  Develop a severe headache.  Faint.  Have blurred vision or spots in your vision.  Have a bad-smelling vaginal discharge.  Have thoughts about hurting yourself or your  baby. If you ever feel like you may hurt yourself or others, or have thoughts about taking your own life, get help right away. You can go to your nearest emergency department or call:  Your local emergency services (911 in the [place]  A suicide crisis helpline, such as the National Suicide Prevention Lifeline at 1-800-273-8255. This is open 24 hours a day. Summary  The period of time from when you deliver your baby to up to 6-12 weeks after delivery is called the postpartum period.  Gradually return to your normal activities as told by your health care provider.  Keep all follow-up visits for you and your baby as told by your health care provider. This information is not intended to replace advice given to you by your health care provider. Make sure you discuss any questions you have with your health care provider. Document Revised: 07/15/2018 Document Reviewed: 07/15/2018 Elsevier Patient Education  2020 Elsevier Inc.    Postpartum Hypertension Postpartum hypertension is high blood pressure that remains higher than normal after childbirth. You may not realize that you have postpartum hypertension if your blood pressure is not being checked regularly. In most cases, postpartum hypertension will go away on its own, usually within a week of delivery. However, for some women, medical treatment is required to prevent serious complications, such as seizures or stroke. What are the causes? This condition may be caused by one or more of the following:  Hypertension that existed before pregnancy (chronic hypertension).  Hypertension that comes on as a result of pregnancy (gestational hypertension).  Hypertensive disorders during pregnancy (preeclampsia) or seizures in women who have high blood pressure during pregnancy (eclampsia).  A condition in which the liver, platelets, and red blood cells are damaged during pregnancy (HELLP syndrome).  A condition in which the thyroid produces too much  hormones (hyperthyroidism).  Other rare problems of the nerves (neurological disorders) or blood disorders. In some cases, the cause may not be known. What increases the risk? The following factors may make you more likely to develop this condition:  Chronic hypertension. In some cases, this may not have been diagnosed before pregnancy.  Obesity.  Type 2 diabetes.  Kidney disease.  History of preeclampsia or eclampsia.  Other medical conditions that change the level of hormones in the body (hormonal imbalance). What are the signs or symptoms? As with all types of hypertension, postpartum hypertension may not have any symptoms. Depending on how high your blood pressure is, you may experience:  Headaches. These may be mild, moderate, or severe.   They may also be steady, constant, or sudden in onset (thunderclap headache).  Changes in your ability to see (visual changes).  Dizziness.  Shortness of breath.  Swelling of your hands, feet, lower legs, or face. In some cases, you may have swelling in more than one of these locations.  Heart palpitations or a racing heartbeat.  Difficulty breathing while lying down.  Decrease in the amount of urine that you pass. Other rare signs and symptoms may include:  Sweating more than usual. This lasts longer than a few days after delivery.  Chest pain.  Sudden dizziness when you get up from sitting or lying down.  Seizures.  Nausea or vomiting.  Abdominal pain. How is this diagnosed? This condition may be diagnosed based on the results of a physical exam, blood pressure measurements, and blood and urine tests. You may also have other tests, such as a CT scan or an MRI, to check for other problems of postpartum hypertension. How is this treated? If blood pressure is high enough to require treatment, your options may include:  Medicines to reduce blood pressure (antihypertensives). Tell your health care provider if you are  breastfeeding or if you plan to breastfeed. There are many antihypertensive medicines that are safe to take while breastfeeding.  Stopping medicines that may be causing hypertension.  Treating medical conditions that are causing hypertension.  Treating the complications of hypertension, such as seizures, stroke, or kidney problems. Your health care provider will also continue to monitor your blood pressure closely until it is within a safe range for you. Follow these instructions at home:  Take over-the-counter and prescription medicines only as told by your health care provider.  Return to your normal activities as told by your health care provider. Ask your health care provider what activities are safe for you.  Do not use any products that contain nicotine or tobacco, such as cigarettes and e-cigarettes. If you need help quitting, ask your health care provider.  Keep all follow-up visits as told by your health care provider. This is important. Contact a health care provider if:  Your symptoms get worse.  You have new symptoms, such as: ? A headache that does not get better. ? Dizziness. ? Visual changes. Get help right away if:  You suddenly develop swelling in your hands, ankles, or face.  You have sudden, rapid weight gain.  You develop difficulty breathing, chest pain, racing heartbeat, or heart palpitations.  You develop severe pain in your abdomen.  You have any symptoms of a stroke. "BE FAST" is an easy way to remember the main warning signs of a stroke: ? B - Balance. Signs are dizziness, sudden trouble walking, or loss of balance. ? E - Eyes. Signs are trouble seeing or a sudden change in vision. ? F - Face. Signs are sudden weakness or numbness of the face, or the face or eyelid drooping on one side. ? A - Arms. Signs are weakness or numbness in an arm. This happens suddenly and usually on one side of the body. ? S - Speech. Signs are sudden trouble speaking,  slurred speech, or trouble understanding what people say. ? T - Time. Time to call emergency services. Write down what time symptoms started.  You have other signs of a stroke, such as: ? A sudden, severe headache with no known cause. ? Nausea or vomiting. ? Seizure. These symptoms may represent a serious problem that is an emergency. Do not wait to see if the symptoms   will go away. Get medical help right away. Call your local emergency services (911 in the [place] Do not drive yourself to the hospital. Summary  Postpartum hypertension is high blood pressure that remains higher than normal after childbirth.  In most cases, postpartum hypertension will go away on its own, usually within a week of delivery.  For some women, medical treatment is required to prevent serious complications, such as seizures or stroke. This information is not intended to replace advice given to you by your health care provider. Make sure you discuss any questions you have with your health care provider. Document Revised: 01/01/2019 Document Reviewed: 09/15/2017 Elsevier Patient Education  2020 Elsevier Inc.  
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
## 2021-05-28 ENCOUNTER — Encounter: Payer: Self-pay | Admitting: Family Medicine

## 2021-05-28 ENCOUNTER — Telehealth: Payer: 59 | Admitting: Family Medicine

## 2021-05-28 VITALS — Wt 130.0 lb

## 2021-05-28 DIAGNOSIS — R058 Other specified cough: Secondary | ICD-10-CM | POA: Diagnosis not present

## 2021-05-28 DIAGNOSIS — R0602 Shortness of breath: Secondary | ICD-10-CM

## 2021-05-28 DIAGNOSIS — R0981 Nasal congestion: Secondary | ICD-10-CM | POA: Diagnosis not present

## 2021-05-28 MED ORDER — AZITHROMYCIN 250 MG PO TABS: ORAL_TABLET | ORAL | 0 refills | Status: AC

## 2021-05-28 NOTE — Progress Notes (Signed)
   Subjective:  Documentation for virtual audio and video telecommunications through Caregility encounter:  The patient was located at her cousins house. 2 patient identifiers used.  The provider was located in the office. The patient did consent to this visit and is aware of possible charges through their insurance for this visit.  The other persons participating in this telemedicine service were none. Time spent on call was 16 minutes and in review of previous records 20 minutes total.  This virtual service is not related to other E/M service within previous 7 days.   Patient ID: Dana Fisher, female    DOB: November 16, 1996, 25 y.o.   MRN: [id number]  HPI Chief Complaint  Patient presents with   other    Weakness, cough, SOB, abdominal pain, diarrhea, and fatigue started two weeks ago chest heavy hard to breathe, tested negative 05/18/21    Complains of a 2 week history (started June 6) of body aches, nasal congestion, coughing up a lot of yellowish-green phlegm more than one week ago. Chest feels "heavy" and has pain when taking a deep breath. Mild sore throat.   Body aches and fever have improved.   Does not smoke. No hx of asthma or lung disease.   States her family members had colds.   She was vaccinated for Covid, [employer] and then [employer] booster.    Is not breastfeeding.  No chance of pregnancy.   Reports having 2 negative Covid tests including one done at an urgent care of 05/18/2021.   Denies chills, body aches, headache, dizziness, chest pain, palpitations, abdominal pain, nausea, vomiting or diarrhea.    Review of Systems Pertinent positives and negatives in the history of present illness.     Objective:   Physical Exam Wt 130 lb (59 kg)   LMP 05/08/2021   BMI 24.56 kg/m   Alert and oriented and in no acute distress.  Respirations unlabored.  She is speaking in complete sentences without difficulty.      Assessment & Plan:  Productive cough  Shortness  of breath  Nasal congestion  Z-Pak prescribed.  Discussed symptomatic treatment including Mucinex DM.  Recommend staying well-hydrated.  She will follow-up if worsening or not significantly better in 1 week.

## 2022-01-21 ENCOUNTER — Telehealth: Payer: Self-pay | Admitting: Physician Assistant

## 2022-01-21 NOTE — Telephone Encounter (Signed)
Pt called and is requesting tamiflu states that her daughter was just diagnosed with it today, she does not have any symptoms, states her baby was exposed on Friday by a family member. And started with the symptoms today's, pt did not want to do a visit,  informed pt that it was totally up to you regarding sending something in She uses [facility] [place] - [id number]

## 2022-01-22 ENCOUNTER — Encounter: Payer: Self-pay | Admitting: Physician Assistant

## 2022-01-22 ENCOUNTER — Other Ambulatory Visit: Payer: Self-pay | Admitting: Physician Assistant

## 2022-01-22 DIAGNOSIS — B349 Viral infection, unspecified: Secondary | ICD-10-CM

## 2022-01-22 MED ORDER — OSELTAMIVIR PHOSPHATE 75 MG PO CAPS: 75.0000 mg | ORAL_CAPSULE | Freq: Every day | ORAL | 0 refills | Status: AC

## 2022-01-22 NOTE — Telephone Encounter (Signed)
Thanks

## 2022-01-22 NOTE — Telephone Encounter (Signed)
Prescription sent

## 2022-10-23 ENCOUNTER — Encounter: Payer: Self-pay | Admitting: Internal Medicine

## 2023-02-11 ENCOUNTER — Other Ambulatory Visit: Payer: Self-pay | Admitting: Family Medicine

## 2023-02-11 ENCOUNTER — Inpatient Hospital Stay (HOSPITAL_COMMUNITY): Admit: 2023-02-11 | Payer: 59

## 2023-02-11 ENCOUNTER — Telehealth: Payer: Self-pay | Admitting: Family Medicine

## 2023-02-11 ENCOUNTER — Encounter: Payer: Self-pay | Admitting: Family Medicine

## 2023-02-11 ENCOUNTER — Other Ambulatory Visit (HOSPITAL_COMMUNITY)
Admission: RE | Admit: 2023-02-11 | Discharge: 2023-02-11 | Disposition: A | Discharge status: Home / Self Care | Payer: 59 | Attending: Family Medicine | Admitting: Family Medicine

## 2023-02-11 DIAGNOSIS — D508 Other iron deficiency anemias: Secondary | ICD-10-CM | POA: Insufficient documentation

## 2023-02-11 DIAGNOSIS — O99011 Anemia complicating pregnancy, first trimester: Secondary | ICD-10-CM | POA: Diagnosis not present

## 2023-02-11 LAB — CBC WITH DIFFERENTIAL/PLATELET
Abs Immature Granulocytes: 0.02 10*3/uL (ref 0.00–0.07)
Basophils Absolute: 0 10*3/uL (ref 0.0–0.1)
Basophils Relative: 0 %
Eosinophils Absolute: 0.1 10*3/uL (ref 0.0–0.5)
Eosinophils Relative: 1 %
HCT: 35.5 % — ABNORMAL LOW (ref 36.0–46.0)
Hemoglobin: 12.6 g/dL (ref 12.0–15.0)
Immature Granulocytes: 0 %
Lymphocytes Relative: 23 %
Lymphs Abs: 1.7 10*3/uL (ref 0.7–4.0)
MCH: 31.2 pg (ref 26.0–34.0)
MCHC: 35.5 g/dL (ref 30.0–36.0)
MCV: 87.9 fL (ref 80.0–100.0)
Monocytes Absolute: 0.6 10*3/uL (ref 0.1–1.0)
Monocytes Relative: 7 %
Neutro Abs: 5.2 10*3/uL (ref 1.7–7.7)
Neutrophils Relative %: 69 %
Platelets: 288 10*3/uL (ref 150–400)
RBC: 4.04 MIL/uL (ref 3.87–5.11)
RDW: 12.6 % (ref 11.5–15.5)
WBC: 7.6 10*3/uL (ref 4.0–10.5)
nRBC: 0 % (ref 0.0–0.2)

## 2023-02-11 LAB — TYPE AND SCREEN
ABO/RH(D): O NEG
Antibody Screen: NEGATIVE

## 2023-02-11 NOTE — Progress Notes (Unsigned)
Patient plans on termination in 1.5 weeks at a [facility].  Had hgb check at [facility] and it was 5.8. She was told she cannot have a termination until HGB was 9.0.  She   Labs ordered today-- CBC and type and screen. Patient has O Neg blood type Will need Rhogam--expect that [facility] will address this.   Plan for blood transfusion with 2 units and return for additional unit after RN at [facility] helped set up appointments  Future Appointments  Date Time Provider [place]  02/13/2023  8:00 AM [facility] None

## 2023-02-11 NOTE — Addendum Note (Signed)
Addended by: Caryl Bis on: 02/11/2023 03:03 PM   Modules accepted: Orders

## 2023-02-11 NOTE — Telephone Encounter (Signed)
Final HGB results showed that patient had hemoglobin that was 100% normal.   Lab Results  Component Value Date   HGB 12.6 02/11/2023   HGB 7.9 (L) 08/09/2020   I discussed with the patient that she can bring her values to [facility] and should be able to get any procedure safely now.   Caren Macadam, MD, MPH, ABFM, [facility] Attending Physician Center for [facility]

## 2023-02-11 NOTE — Progress Notes (Signed)
Patient came to [facility] because she needed an "iron infusion"  She has a planned termination in 1.5 weeks

## 2023-02-13 ENCOUNTER — Encounter (HOSPITAL_COMMUNITY): Payer: 59

## 2023-05-26 ENCOUNTER — Ambulatory Visit
Admission: EM | Admit: 2023-05-26 | Discharge: 2023-05-26 | Disposition: A | Discharge status: Home / Self Care | Payer: 59 | Attending: Nurse Practitioner | Admitting: Nurse Practitioner

## 2023-05-26 DIAGNOSIS — R300 Dysuria: Secondary | ICD-10-CM | POA: Insufficient documentation

## 2023-05-26 DIAGNOSIS — E039 Hypothyroidism, unspecified: Secondary | ICD-10-CM | POA: Insufficient documentation

## 2023-05-26 DIAGNOSIS — N3001 Acute cystitis with hematuria: Secondary | ICD-10-CM | POA: Insufficient documentation

## 2023-05-26 DIAGNOSIS — Z760 Encounter for issue of repeat prescription: Secondary | ICD-10-CM | POA: Insufficient documentation

## 2023-05-26 LAB — POCT URINALYSIS DIP (MANUAL ENTRY)
Bilirubin, UA: NEGATIVE
Glucose, UA: NEGATIVE mg/dL
Ketones, POC UA: NEGATIVE mg/dL
Nitrite, UA: POSITIVE — AB
Protein Ur, POC: 30 mg/dL — AB
Spec Grav, UA: 1.025 (ref 1.010–1.025)
Urobilinogen, UA: 0.2 E.U./dL
pH, UA: 7 (ref 5.0–8.0)

## 2023-05-26 LAB — POCT URINE PREGNANCY: Preg Test, Ur: NEGATIVE

## 2023-05-26 MED ORDER — PHENAZOPYRIDINE HCL 200 MG PO TABS: 200.0000 mg | ORAL_TABLET | Freq: Three times a day (TID) | ORAL | 0 refills | Status: DC

## 2023-05-26 MED ORDER — LEVOTHYROXINE SODIUM 50 MCG PO TABS: 50.0000 ug | ORAL_TABLET | Freq: Every day | ORAL | 0 refills | Status: DC

## 2023-05-26 MED ORDER — NITROFURANTOIN MONOHYD MACRO 100 MG PO CAPS: 100.0000 mg | ORAL_CAPSULE | Freq: Two times a day (BID) | ORAL | 0 refills | Status: DC

## 2023-05-26 NOTE — Discharge Instructions (Signed)
You have an urinary tract infection (UTI).   Take antibiotics as prescribed.   Make sure you finish all the antibiotics even if you start to feel better.   We have sent your urine out for cultures which is a lab test to check for bacteria or other germs in your urine sample. You will only be notified about these results if we have to change your antibiotics.   Make sure you: Empty your bladder often and completely.  Do not hold urine for long periods of time. Empty your bladder after sex. Wipe from front to back after urinating or having a bowel movement if you are female. Drink enough fluid to keep your urine pale yellow.  Go to the ED immediately if:  You have severe pain in your back or your lower abdomen. You have a fever or chills. You have nausea or vomiting. 
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
## 2023-05-26 NOTE — ED Triage Notes (Signed)
Pt c/o UTI symptoms, extreme lower back pain, blood in urine, burning with urination, pressure in abdomen, pt has taken pyridium urine may be orange from this.

## 2023-05-27 ENCOUNTER — Ambulatory Visit: Payer: Self-pay

## 2023-05-28 LAB — URINE CULTURE: Culture: 30000 — AB

## 2023-12-27 ENCOUNTER — Emergency Department (HOSPITAL_COMMUNITY)
Admission: EM | Admit: 2023-12-27 | Discharge: 2023-12-28 | Disposition: A | Discharge status: Other Acute Inpatient Hospital | Payer: Medicaid Other | Attending: Emergency Medicine | Admitting: Emergency Medicine

## 2023-12-27 ENCOUNTER — Other Ambulatory Visit: Payer: Self-pay

## 2023-12-27 ENCOUNTER — Encounter (HOSPITAL_COMMUNITY): Payer: Self-pay

## 2023-12-27 DIAGNOSIS — F1910 Other psychoactive substance abuse, uncomplicated: Secondary | ICD-10-CM | POA: Insufficient documentation

## 2023-12-27 DIAGNOSIS — E876 Hypokalemia: Secondary | ICD-10-CM | POA: Insufficient documentation

## 2023-12-27 DIAGNOSIS — R45851 Suicidal ideations: Secondary | ICD-10-CM | POA: Insufficient documentation

## 2023-12-27 DIAGNOSIS — F3130 Bipolar disorder, current episode depressed, mild or moderate severity, unspecified: Secondary | ICD-10-CM | POA: Insufficient documentation

## 2023-12-27 DIAGNOSIS — F3181 Bipolar II disorder: Secondary | ICD-10-CM | POA: Diagnosis present

## 2023-12-27 LAB — CBC
HCT: 38.2 % (ref 36.0–46.0)
Hemoglobin: 13.2 g/dL (ref 12.0–15.0)
MCH: 30.8 pg (ref 26.0–34.0)
MCHC: 34.6 g/dL (ref 30.0–36.0)
MCV: 89 fL (ref 80.0–100.0)
Platelets: 395 10*3/uL (ref 150–400)
RBC: 4.29 MIL/uL (ref 3.87–5.11)
RDW: 12.5 % (ref 11.5–15.5)
WBC: 9.4 10*3/uL (ref 4.0–10.5)
nRBC: 0 % (ref 0.0–0.2)

## 2023-12-27 LAB — COMPREHENSIVE METABOLIC PANEL
ALT: 18 U/L (ref 0–44)
AST: 26 U/L (ref 15–41)
Albumin: 3.8 g/dL (ref 3.5–5.0)
Alkaline Phosphatase: 65 U/L (ref 38–126)
Anion gap: 10 (ref 5–15)
BUN: 11 mg/dL (ref 6–20)
CO2: 24 mmol/L (ref 22–32)
Calcium: 9 mg/dL (ref 8.9–10.3)
Chloride: 105 mmol/L (ref 98–111)
Creatinine, Ser: 0.74 mg/dL (ref 0.44–1.00)
GFR, Estimated: >60 mL/min (ref >60)
Glucose, Bld: 98 mg/dL (ref 70–99)
Potassium: 3.3 mmol/L — ABNORMAL LOW (ref 3.5–5.1)
Sodium: 139 mmol/L (ref 135–145)
Total Bilirubin: 0.7 mg/dL (ref 0.0–1.2)
Total Protein: 6.3 g/dL — ABNORMAL LOW (ref 6.5–8.1)

## 2023-12-27 LAB — ETHANOL: Alcohol, Ethyl (B): <10 mg/dL (ref <10)

## 2023-12-27 LAB — HCG, SERUM, QUALITATIVE: Preg, Serum: NEGATIVE

## 2023-12-27 MED ORDER — ARIPIPRAZOLE 10 MG PO TABS
10.0000 mg | ORAL_TABLET | Freq: Every day | ORAL | Status: DC
  Administered 2023-12-28: 10 mg via ORAL
  Filled 2023-12-27: qty 1

## 2023-12-27 MED ORDER — POTASSIUM CHLORIDE CRYS ER 20 MEQ PO TBCR
40.0000 meq | EXTENDED_RELEASE_TABLET | Freq: Once | ORAL | Status: AC
  Administered 2023-12-27: 40 meq via ORAL
  Filled 2023-12-27: qty 2
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
## 2023-12-27 NOTE — ED Provider Notes (Signed)
EMERGENCY DEPARTMENT AT [facility] Provider Note   CSN: [id number] Arrival date & time: 12/27/23  1200     History  Chief Complaint  Patient presents with   Suicidal    Dana Fisher is a 28 y.o. female with past medical history significant for anxiety, bipolar disorder, previous suicide intent, intentional overdose, panic disorder presents under IVC paperwork by mother.  IVC paperwork reports that she has not slept in 4 days, she texted a friend that she was going to kill her self by drinking antifreeze.  She has been agitated, fighting others, she is drinking excessively and using cocaine.  Patient denies any of this, but is not engaging with exam, refusing to answer questions.  She does not seem to be responding to internal stimuli.  HPI     Home Medications Prior to Admission medications   Medication Sig Start Date End Date Taking? Authorizing Provider  levothyroxine (SYNTHROID) 50 MCG tablet Take 1 tablet (50 mcg total) by mouth daily. 05/26/23 06/25/23  Lurline Idol, FNP  nitrofurantoin, macrocrystal-monohydrate, (MACROBID) 100 MG capsule Take 1 capsule (100 mg total) by mouth 2 (two) times daily. 05/26/23   Lurline Idol, FNP  phenazopyridine (PYRIDIUM) 200 MG tablet Take 1 tablet (200 mg total) by mouth 3 (three) times daily. 05/26/23   Lurline Idol, FNP      Allergies    Morphine    Review of Systems   Review of Systems  All other systems reviewed and are negative.   Physical Exam Updated Vital Signs BP 124/82 (BP Location: Right Arm)   Pulse 85   Temp 98.2 F (36.8 C) (Oral)   Resp 18   SpO2 100%  Physical Exam Vitals and nursing note reviewed.  Constitutional:      General: She is not in acute distress.    Appearance: Normal appearance.  HENT:     Head: Normocephalic and atraumatic.  Eyes:     General:        Right eye: No discharge.        Left eye: No discharge.  Cardiovascular:     Rate and Rhythm: Normal rate  and regular rhythm.     Heart sounds: No murmur heard.    No friction rub. No gallop.  Pulmonary:     Effort: Pulmonary effort is normal.     Breath sounds: Normal breath sounds.  Abdominal:     General: Bowel sounds are normal.     Palpations: Abdomen is soft.  Skin:    General: Skin is warm and dry.     Capillary Refill: Capillary refill takes less than 2 seconds.  Neurological:     Mental Status: She is alert and oriented to person, place, and time.  Psychiatric:        Mood and Affect: Mood normal.        Behavior: Behavior normal.     Comments: faking sleeping throughout exam but responds easily to questions when roused, denies SI, HI, AVH.  Not responding to internal stimuli, not engaging with exam.     ED Results / Procedures / Treatments   Labs (all labs ordered are listed, but only abnormal results are displayed) Labs Reviewed  COMPREHENSIVE METABOLIC PANEL - Abnormal; Notable for the following components:      Result Value   Potassium 3.3 (*)    Total Protein 6.3 (*)    All other components within normal limits  ETHANOL  CBC  RAPID URINE DRUG SCREEN,  HOSP PERFORMED  HCG, SERUM, QUALITATIVE    EKG EKG Interpretation Date/Time:  Saturday December 27 2023 12:11:21 EST Ventricular Rate:  74 PR Interval:  170 QRS Duration:  80 QT Interval:  384 QTC Calculation: 426 R Axis:   86  Text Interpretation: Normal sinus rhythm Nonspecific T wave abnormality Abnormal ECG When compared with ECG of 08-Aug-2020 17:29, PREVIOUS ECG IS PRESENT Confirmed by Fulton Reek [phone]) on 12/27/2023 1:13:52 PM  Radiology No results found.  Procedures Procedures    Medications Ordered in ED Medications  potassium chloride SA (KLOR-CON M) CR tablet 40 mEq (has no administration in time range)    ED Course/ Medical Decision Making/ A&P Clinical Course as of 12/27/23 1403  Sat Dec 27, 2023  1402 Medically clear for TTS eval [CP]    Clinical Course User Index [CP]  Olene Floss, PA-C                                 Medical Decision Making Amount and/or Complexity of Data Reviewed Labs: ordered.   Patient is a 28 y.o. female  who presents to the emergency department for psychiatric complaint.  Past Medical History: anxiety, bipolar disorder, previous suicide intent, intentional overdose, panic disorder  Physical Exam: faking sleeping throughout exam but responds easily to questions when roused, denies SI, HI, AVH.  Not responding to internal stimuli, not engaging with exam.  Labs: Medical clearance labs ordered, with following pertinent results: CBC, ethanol all unremarkable, UDS unremarkable, serum pregnancy test negative.  Potassium mildly low at 3.3, we will orally replete.  Otherwise CMP is unremarkable.  Cardiac monitoring: EKG obtained and interpreted by attending physician which shows: Normal sinus rhythm  We will uphold IVC, first exam paperwork completed, and we will wait for TTS evaluation.  Disposition: Patient is otherwise medically cleared at this time pending medical clearance laboratory evaluation. Will consult TTS and appreciate their recommendations.  I discussed this case with my attending physician Dr. Earlene Plater who cosigned this note including patient's presenting symptoms, physical exam, and planned diagnostics and interventions. Attending physician stated agreement with plan or made changes to plan which were implemented.   Final Clinical Impression(s) / ED Diagnoses Final diagnoses:  Suicidal ideation  Polysubstance abuse [facility])    Rx / DC Orders ED Discharge Orders     None         [place] 12/27/23 1403    Laurence Spates, MD 12/28/23 [phone]

## 2023-12-27 NOTE — ED Notes (Signed)
Pt changed out in EMS triage. BH packet and pt belongings were left with primary RN.
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
## 2023-12-28 ENCOUNTER — Encounter (HOSPITAL_COMMUNITY): Payer: Self-pay | Admitting: Nurse Practitioner

## 2023-12-28 ENCOUNTER — Encounter (HOSPITAL_COMMUNITY): Payer: Self-pay

## 2023-12-28 ENCOUNTER — Inpatient Hospital Stay (HOSPITAL_COMMUNITY)
Admission: AD | Admit: 2023-12-28 | Discharge: 2024-01-01 | DRG: 885 | Disposition: A | Discharge status: Home / Self Care | Payer: Medicaid Other | Source: Intra-hospital | Attending: Psychiatry | Admitting: Psychiatry

## 2023-12-28 DIAGNOSIS — G43109 Migraine with aura, not intractable, without status migrainosus: Secondary | ICD-10-CM | POA: Diagnosis present

## 2023-12-28 DIAGNOSIS — Z87891 Personal history of nicotine dependence: Secondary | ICD-10-CM | POA: Diagnosis not present

## 2023-12-28 DIAGNOSIS — I959 Hypotension, unspecified: Secondary | ICD-10-CM | POA: Diagnosis present

## 2023-12-28 DIAGNOSIS — F1090 Alcohol use, unspecified, uncomplicated: Secondary | ICD-10-CM | POA: Insufficient documentation

## 2023-12-28 DIAGNOSIS — F1210 Cannabis abuse, uncomplicated: Secondary | ICD-10-CM | POA: Diagnosis present

## 2023-12-28 DIAGNOSIS — R000 Tachycardia, unspecified: Secondary | ICD-10-CM | POA: Diagnosis present

## 2023-12-28 DIAGNOSIS — F410 Panic disorder [episodic paroxysmal anxiety] without agoraphobia: Secondary | ICD-10-CM | POA: Diagnosis present

## 2023-12-28 DIAGNOSIS — F3181 Bipolar II disorder: Secondary | ICD-10-CM | POA: Diagnosis not present

## 2023-12-28 DIAGNOSIS — F3130 Bipolar disorder, current episode depressed, mild or moderate severity, unspecified: Secondary | ICD-10-CM | POA: Diagnosis present

## 2023-12-28 DIAGNOSIS — Z833 Family history of diabetes mellitus: Secondary | ICD-10-CM

## 2023-12-28 DIAGNOSIS — Z9151 Personal history of suicidal behavior: Secondary | ICD-10-CM

## 2023-12-28 DIAGNOSIS — R064 Hyperventilation: Secondary | ICD-10-CM | POA: Diagnosis present

## 2023-12-28 DIAGNOSIS — E039 Hypothyroidism, unspecified: Secondary | ICD-10-CM | POA: Diagnosis present

## 2023-12-28 DIAGNOSIS — E876 Hypokalemia: Secondary | ICD-10-CM | POA: Diagnosis present

## 2023-12-28 DIAGNOSIS — R45851 Suicidal ideations: Secondary | ICD-10-CM | POA: Diagnosis present

## 2023-12-28 DIAGNOSIS — F1510 Other stimulant abuse, uncomplicated: Secondary | ICD-10-CM | POA: Diagnosis present

## 2023-12-28 DIAGNOSIS — F29 Unspecified psychosis not due to a substance or known physiological condition: Secondary | ICD-10-CM | POA: Diagnosis present

## 2023-12-28 DIAGNOSIS — Z885 Allergy status to narcotic agent status: Secondary | ICD-10-CM | POA: Diagnosis not present

## 2023-12-28 DIAGNOSIS — Z79899 Other long term (current) drug therapy: Secondary | ICD-10-CM | POA: Diagnosis not present

## 2023-12-28 DIAGNOSIS — F411 Generalized anxiety disorder: Secondary | ICD-10-CM | POA: Diagnosis present

## 2023-12-28 DIAGNOSIS — Z79890 Hormone replacement therapy (postmenopausal): Secondary | ICD-10-CM | POA: Diagnosis not present

## 2023-12-28 DIAGNOSIS — F4310 Post-traumatic stress disorder, unspecified: Secondary | ICD-10-CM | POA: Diagnosis present

## 2023-12-28 DIAGNOSIS — Z8249 Family history of ischemic heart disease and other diseases of the circulatory system: Secondary | ICD-10-CM | POA: Diagnosis not present

## 2023-12-28 DIAGNOSIS — F1290 Cannabis use, unspecified, uncomplicated: Secondary | ICD-10-CM | POA: Insufficient documentation

## 2023-12-28 DIAGNOSIS — Z91148 Patient's other noncompliance with medication regimen for other reason: Secondary | ICD-10-CM | POA: Diagnosis not present

## 2023-12-28 DIAGNOSIS — F1590 Other stimulant use, unspecified, uncomplicated: Secondary | ICD-10-CM | POA: Insufficient documentation

## 2023-12-28 DIAGNOSIS — F1410 Cocaine abuse, uncomplicated: Secondary | ICD-10-CM | POA: Insufficient documentation

## 2023-12-28 HISTORY — DX: Hypothyroidism, unspecified: E03.9

## 2023-12-28 LAB — RAPID URINE DRUG SCREEN, HOSP PERFORMED
Amphetamines: NOT DETECTED
Barbiturates: NOT DETECTED
Benzodiazepines: POSITIVE — AB
Cocaine: POSITIVE — AB
Opiates: NOT DETECTED
Tetrahydrocannabinol: NOT DETECTED

## 2023-12-28 MED ORDER — ALUM & MAG HYDROXIDE-SIMETH 200-200-20 MG/5ML PO SUSP: 30.0000 mL | ORAL | Status: DC | PRN

## 2023-12-28 MED ORDER — LORAZEPAM 2 MG/ML IJ SOLN: 2.0000 mg | Freq: Three times a day (TID) | INTRAMUSCULAR | Status: DC | PRN

## 2023-12-28 MED ORDER — DIPHENHYDRAMINE HCL 50 MG/ML IJ SOLN: 50.0000 mg | Freq: Three times a day (TID) | INTRAMUSCULAR | Status: DC | PRN

## 2023-12-28 MED ORDER — HALOPERIDOL 5 MG PO TABS: 5.0000 mg | ORAL_TABLET | Freq: Three times a day (TID) | ORAL | Status: DC | PRN

## 2023-12-28 MED ORDER — LORAZEPAM 1 MG PO TABS
1.0000 mg | ORAL_TABLET | ORAL | Status: DC | PRN
  Administered 2023-12-28: 1 mg via ORAL
  Filled 2023-12-28: qty 1

## 2023-12-28 MED ORDER — HALOPERIDOL LACTATE 5 MG/ML IJ SOLN: 5.0000 mg | Freq: Three times a day (TID) | INTRAMUSCULAR | Status: DC | PRN

## 2023-12-28 MED ORDER — DIPHENHYDRAMINE HCL 25 MG PO CAPS: 50.0000 mg | ORAL_CAPSULE | Freq: Three times a day (TID) | ORAL | Status: DC | PRN

## 2023-12-28 MED ORDER — HALOPERIDOL LACTATE 5 MG/ML IJ SOLN: 10.0000 mg | Freq: Three times a day (TID) | INTRAMUSCULAR | Status: DC | PRN

## 2023-12-28 MED ORDER — HYDROXYZINE HCL 25 MG PO TABS
25.0000 mg | ORAL_TABLET | Freq: Three times a day (TID) | ORAL | Status: DC | PRN
  Administered 2023-12-29 – 2023-12-30 (×3): 25 mg via ORAL
  Filled 2023-12-28 (×3): qty 1

## 2023-12-28 MED ORDER — ACETAMINOPHEN 325 MG PO TABS: 650.0000 mg | ORAL_TABLET | Freq: Four times a day (QID) | ORAL | Status: DC | PRN

## 2023-12-28 MED ORDER — MAGNESIUM HYDROXIDE 400 MG/5ML PO SUSP: 30.0000 mL | Freq: Every day | ORAL | Status: DC | PRN

## 2023-12-28 MED ORDER — TRAZODONE HCL 50 MG PO TABS
50.0000 mg | ORAL_TABLET | Freq: Every evening | ORAL | Status: DC | PRN
  Administered 2023-12-28 – 2023-12-29 (×2): 50 mg via ORAL
  Filled 2023-12-28 (×2): qty 1

## 2023-12-28 NOTE — ED Notes (Signed)
Pt back up to desk to make a phone call. Pt appears anxious and increasingly agitated.

## 2023-12-28 NOTE — ED Notes (Signed)
Visitor at bedside.

## 2023-12-28 NOTE — ED Notes (Signed)
Pt requesting to be discharged, this RN updated pt on status. Pt using phone. Pt is becoming agitated.

## 2023-12-28 NOTE — Progress Notes (Signed)
[facility] LCSW Progress Note   12/28/2023    12:19 PM  Dana Fisher   [id number]   Type of Contact and Topic:  Psychiatric Bed Placement   Pt accepted to [facility] 400-1    Patient meets inpatient criteria per [name], NP   The attending provider will be Dr. Sherron Flemings   Call report to [phone]    Herma Carson, RN @ [facility] ED notified.     Pt scheduled  to arrive at [facility] TODAY @ 2100.    Damita Dunnings, MSW, LCSW-A  12:19 PM 12/28/2023

## 2023-12-28 NOTE — ED Notes (Addendum)
Pt up to bathroom, reporting constipation- refuse stool softener.

## 2023-12-28 NOTE — ED Provider Notes (Signed)
Assumed care of the patient.  Was notified that they have been accepted to [facility].  Patient is stable for transport.   Melene Plan, DO 12/28/23 2127

## 2023-12-28 NOTE — ED Notes (Signed)
Pt up to nursing station to ask "when can I get out of here".

## 2023-12-28 NOTE — ED Notes (Signed)
New sitter at bedside. Report provided to new sitter.

## 2023-12-29 ENCOUNTER — Encounter (HOSPITAL_COMMUNITY): Payer: Self-pay

## 2023-12-29 ENCOUNTER — Other Ambulatory Visit: Payer: Self-pay

## 2023-12-29 LAB — TSH: TSH: 0.766 u[IU]/mL (ref 0.350–4.500)

## 2023-12-29 LAB — BASIC METABOLIC PANEL
Anion gap: 10 (ref 5–15)
BUN: 10 mg/dL (ref 6–20)
CO2: 26 mmol/L (ref 22–32)
Calcium: 9.6 mg/dL (ref 8.9–10.3)
Chloride: 105 mmol/L (ref 98–111)
Creatinine, Ser: 0.59 mg/dL (ref 0.44–1.00)
GFR, Estimated: >60 mL/min (ref >60)
Glucose, Bld: 68 mg/dL — ABNORMAL LOW (ref 70–99)
Potassium: 3.4 mmol/L — ABNORMAL LOW (ref 3.5–5.1)
Sodium: 141 mmol/L (ref 135–145)

## 2023-12-29 LAB — FOLATE: Folate: 7.6 ng/mL (ref >5.9)

## 2023-12-29 LAB — T4, FREE: Free T4: 0.8 ng/dL (ref 0.61–1.12)

## 2023-12-29 LAB — VITAMIN D 25 HYDROXY (VIT D DEFICIENCY, FRACTURES): Vit D, 25-Hydroxy: 19.79 ng/mL — ABNORMAL LOW (ref 30–100)

## 2023-12-29 LAB — VITAMIN B12: Vitamin B-12: 269 pg/mL (ref 180–914)

## 2023-12-29 MED ORDER — LEVOTHYROXINE SODIUM 88 MCG PO TABS
88.0000 ug | ORAL_TABLET | Freq: Every day | ORAL | Status: DC
  Administered 2023-12-30 – 2024-01-01 (×3): 88 ug via ORAL
  Filled 2023-12-29 (×5): qty 1

## 2023-12-29 MED ORDER — LURASIDONE HCL 20 MG PO TABS
20.0000 mg | ORAL_TABLET | Freq: Every day | ORAL | Status: DC
  Administered 2023-12-29: 20 mg via ORAL
  Filled 2023-12-29 (×2): qty 1

## 2023-12-29 NOTE — [facility] Group Notes (Signed)
[facility] Group Notes:  (Nursing/MHT/Case Management/Adjunct)  Date:  12/29/2023  Time:  9:41 PM  Type of Therapy:  Psychoeducational Skills  Participation Level:  Did Not Attend  Participation Quality:  Resistant  Affect:  Resistant  Cognitive:  Lacking  Insight:  None  Engagement in Group:  None  Modes of Intervention:  Education  Summary of Progress/Problems: The patient did not attend group this evening.   Dana Fisher 12/29/2023, 9:41 PM

## 2023-12-29 NOTE — Plan of Care (Signed)
  Problem: Education: Goal: Knowledge of [facility] Education information/materials will improve Outcome: Progressing Goal: Mental status will improve Outcome: Progressing Goal: Verbalization of understanding the information provided will improve Outcome: Progressing   Problem: Coping: Goal: Ability to demonstrate self-control will improve Outcome: Progressing   Problem: Education: Goal: Emotional status will improve Outcome: Not Progressing
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
## 2023-12-29 NOTE — Progress Notes (Signed)
Patient ID: Dana Fisher, female   DOB: 1996/08/02, 28 y.o.   MRN: [id number]  Patient admitted to [facility] - 400-1, SI with Plan to drink Antifreeze/Polysubtance Abuse. She is allergic to Morphine. Patient is A/O x 4, ambulatory. She is anxious, and slightly irritable. On assessment, patient denies SI/HI/AVH. Admission Process/Education completed. Patient verbalized understanding. Consents signed. Skin assessment/Non invasive body search completed with additional staff present. Patient has a noted scratch to her left thumb that is dry/intact and healing. She states her cat scratched her. Patient has multiple tattoo( L/R Thigh, Left shoulder and LLE). She has body piercing to the Left Nipple and abdomen. Patient oriented to unit/pt room. Patient offered a meal/snack in which she received. Safety maintained.  Per Chart Review:  Dana Fisher is a 28 y.o. female admitted: Presented to the EDfor 12/27/2023 12:00 PM under IVC by her mother due to not sleeping in 4 days, cocaine use, alcohol use, and making suicidal threats. She carries the psychiatric diagnoses of Bipolar disorder.   Her current presentation of depression and suicidal ideations is most consistent with bipolar depression and polysubstance abuse. She meets criteria for inpatient treatment based on suicidal ideations.  Current outpatient psychotropic medications include none.Marland Kitchen She was not compliant with medications prior to admission as evidenced by report from mother. On initial examination, patient is lethargic, has difficulty engaging in assessment, irritable. Please see plan below for detailed recommendations.    Diagnoses:  Active Hospital problems: Principal Problem:   Bipolar 2 disorder, major depressive episode (HCC) Active Problems:   Polysubstance abuse (HCC)

## 2023-12-29 NOTE — Group Note (Signed)
Occupational Therapy Group Note  Group Topic: Sleep Hygiene  Group Date: 12/29/2023 Start Time: 1430 End Time: 1500 Facilitators: Ted Mcalpine, OT   Group Description: Group encouraged increased participation and engagement through topic focused on sleep hygiene. Patients reflected on the quality of sleep they typically receive and identified areas that need improvement. Group was given background information on sleep and sleep hygiene, including common sleep disorders. Group members also received information on how to improve one's sleep and introduced a sleep diary as a tool that can be utilized to track sleep quality over a length of time. Group session ended with patients identifying one or more strategies they could utilize or implement into their sleep routine in order to improve overall sleep quality.        Therapeutic Goal(s):  Identify one or more strategies to improve overall sleep hygiene  Identify one or more areas of sleep that are negatively impacted (sleep too much, too little, etc)     Participation Level: Engaged   Participation Quality: Independent   Behavior: Appropriate   Speech/Thought Process: Relevant   Affect/Mood: Appropriate   Insight: Fair   Judgement: Fair      Modes of Intervention: Education  Patient Response to Interventions:  Attentive   Plan: Continue to engage patient in OT groups 2 - 3x/week.  12/29/2023  Ted Mcalpine, OT   Kerrin Champagne, OT

## 2023-12-29 NOTE — [facility] Counselor (Signed)
Adult Comprehensive Assessment  Patient ID: Dana Fisher, female   DOB: [birth year], 28 y.o.   MRN: [id number]  Information Source: Information source: Patient  Current Stressors:  Patient states their primary concerns and needs for treatment are:: "trauma and substance abuse" Patient states their goals for this hospitilization and ongoing recovery are:: "medication management" Educational / Learning stressors: "no" Employment / Job issues: "no" Family Relationships: "noEngineer, petroleum / Lack of resources (include bankruptcy): "no" Housing / Lack of housing: "no" Physical health (include injuries & life threatening diseases): "no" Social relationships: "no" Substance abuse: "alcohol" Bereavement / Loss: ""no"  Living/Environment/Situation:  Living Arrangements: Spouse/significant other, Children Who else lives in the home?: boyfriend and daughter age 22 How long has patient lived in current situation?: "2  years" What is atmosphere in current home: Comfortable  Family History:  Marital status: Single Are you sexually active?: Yes Does patient have children?: Yes How many children?: 1  Childhood History:  By whom was/is the patient raised?: Grandparents Description of patient's relationship with caregiver when they were a child: "fine" Patient's description of current relationship with people who raised him/her: "good" How were you disciplined when you got in trouble as a child/adolescent?: "i dont know" Does patient have siblings?: Yes Number of Siblings: 1 Description of patient's current relationship with siblings: a little sister 76 years younger than me" Did patient suffer any verbal/emotional/physical/sexual abuse as a child?: No Did patient suffer from severe childhood neglect?: No Has patient ever been sexually abused/assaulted/raped as an adolescent or adult?: No Was the patient ever a victim of a crime or a disaster?: No Witnessed domestic violence?: No Has patient  been affected by domestic violence as an adult?: No  Education:  Highest grade of school patient has completed: "1 year of college , English" Currently a Consulting civil engineer?: No Learning disability?: No  Employment/Work Situation:   Employment Situation: Unemployed What is the Longest Time Patient has Held a Job?: [employer] for 5 years Where was the Patient Employed at that Time?: [employer] Has Patient ever Been in the [employer]?: No  Financial Resources:   Surveyor, quantity resources: Sales executive, Medicaid Does patient have a Lawyer or guardian?: No  Alcohol/Substance Abuse:   What has been your use of drugs/alcohol within the last 12 months?: alcohol If attempted suicide, did drugs/alcohol play a role in this?: Yes Alcohol/Substance Abuse Treatment Hx: Attends AA/NA, Past detox If yes, describe treatment: "yes" Has alcohol/substance abuse ever caused legal problems?: Yes  Social Support System:   Patient's Community Support System: Good Describe Community Support System: "family and friends" Type of faith/religion: "no"  Leisure/Recreation:   Do You Have Hobbies?: Yes Leisure and Hobbies: "dance and run"  Strengths/Needs:   What is the patient's perception of their strengths?: "i dont know" Patient states they can use these personal strengths during their treatment to contribute to their recovery: "i dont know" Patient states these barriers may affect/interfere with their treatment: "i dont think so" Patient states these barriers may affect their return to the community: "no" Other important information patient would like considered in planning for their treatment: "medication management"  Discharge Plan:   Currently receiving community mental health services: No Patient states concerns and preferences for aftercare planning are: "i am in the process of going to therapy" Patient states they will know when they are safe and ready for discharge when: "i already feel better now,  but once i talk to someone" Does patient have access to transportation?: Yes Does patient have  financial barriers related to discharge medications?: No Patient description of barriers related to discharge medications: "no" Will patient be returning to same living situation after discharge?: Yes  Summary/Recommendations:   Summary and Recommendations (to be completed by the evaluator): 28 y.o SI with Plan to drink Antifreeze/Polysubtance Abuse. Pt report hx of trauma and alcohol abuse. Past detox treatment. Pt would benefit from therapy and medicaction management to include additional support in the community  [employer] LCSWA 12/29/2023

## 2023-12-29 NOTE — Progress Notes (Signed)
   12/29/23 0941  Psych Admission Type (Psych Patients Only)  Admission Status Involuntary  Psychosocial Assessment  Patient Complaints Irritability  Eye Contact Fair  Facial Expression Anxious  Affect Anxious;Irritable  Speech Logical/coherent  Interaction Assertive  Motor Activity Fidgety  Appearance/Hygiene Disheveled  Behavior Characteristics Agitated;Anxious  Mood Anxious;Irritable  Thought Process  Coherency WDL  Content WDL  Delusions None reported or observed  Perception WDL  Hallucination None reported or observed  Judgment Impaired  Confusion None

## 2023-12-29 NOTE — Group Note (Signed)
Recreation Therapy Group Note   Group Topic:Communication  Group Date: 12/29/2023 Start Time: 0933 End Time: 0955 Facilitators: Brynleigh Sequeira-McCall, LRT,CTRS Location: [address] Dayroom   Group Topic: Communication, Problem Solving   Goal Area(s) Addresses:  Patient will effectively listen to complete activity.  Patient will identify communication skills used to make activity successful.  Patient will identify how skills used during activity can be used to reach post d/c goals.    Intervention: Building surveyor Activity - Geometric pattern cards, pencils, blank paper    Activity: Geometric Drawings.  Three volunteers from the peer group will be shown an abstract picture with a particular arrangement of geometrical shapes.  Each round, one 'speaker' will describe the pattern, as accurately as possible without revealing the image to the group.  The remaining group members will listen and draw the picture to reflect how it is described to them. Patients with the role of 'listener' cannot ask clarifying questions but, may request that the speaker repeat a direction. Once the drawings are complete, the presenter will show the rest of the group the picture and compare how close each person came to drawing the picture. LRT will facilitate a post-activity discussion regarding effective communication and the importance of planning, listening, and asking for clarification in daily interactions with others.  Education: Environmental consultant, Active listening, Support systems, Discharge planning  Education Outcome: Acknowledges understanding/In group clarification offered/Needs additional education.    Affect/Mood: Flat   Participation Level: Active   Participation Quality: Independent   Behavior: Attentive    Speech/Thought Process: Distracted   Insight: Moderate   Judgement: Moderate   Modes of Intervention: Activity   Patient Response to Interventions:  Receptive   Education  Outcome:  In group clarification offered    Clinical Observations/Individualized Feedback: Pt was flat and appeared distracted. Pt was attentive to the instructions of the group and attempted to complete some of the activity. Pt left early and didn't return.     Plan: Continue to engage patient in RT group sessions 2-3x/week.   Wilena Tyndall-McCall, LRT,CTRS 12/29/2023 11:36 AM
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
## 2023-12-29 NOTE — Plan of Care (Signed)
  Problem: Education: Goal: Knowledge of [facility] Education information/materials will improve Outcome: Progressing Goal: Verbalization of understanding the information provided will improve Outcome: Progressing   

## 2023-12-29 NOTE — [facility] Suicide Risk Assessment (Signed)
[facility] INPATIENT:  Family/Significant Other Suicide Prevention Education  Suicide Prevention Education: education completed with the patient Patient Refusal for Family/Significant Other Suicide Prevention Education: The patient Dana Fisher has refused to provide written consent for family/significant other to be provided Family/Significant Other Suicide Prevention Education during admission and/or prior to discharge.  Physician notified.  Steffanie Dunn LCSWA 12/29/2023, 9:37 AM

## 2023-12-29 NOTE — BH IP Treatment Plan (Addendum)
Interdisciplinary Treatment and Diagnostic Plan Update  12/29/2023 Time of Session: 11:10AM Dana Fisher MRN: [id number]  Principal Diagnosis: Suicidal ideations  Secondary Diagnoses: Principal Problem:   Suicidal ideations   Current Medications:  Current Facility-Administered Medications  Medication Dose Route Frequency Provider Last Rate Last Admin   acetaminophen (TYLENOL) tablet 650 mg  650 mg Oral Q6H PRN Bobbitt, Shalon E, NP       alum & mag hydroxide-simeth (MAALOX/MYLANTA) 200-200-20 MG/5ML suspension 30 mL  30 mL Oral Q4H PRN Bobbitt, Shalon E, NP       haloperidol (HALDOL) tablet 5 mg  5 mg Oral TID PRN Bobbitt, Shalon E, NP       And   diphenhydrAMINE (BENADRYL) capsule 50 mg  50 mg Oral TID PRN Bobbitt, Shalon E, NP       haloperidol lactate (HALDOL) injection 5 mg  5 mg Intramuscular TID PRN Bobbitt, Shalon E, NP       And   diphenhydrAMINE (BENADRYL) injection 50 mg  50 mg Intramuscular TID PRN Bobbitt, Shalon E, NP       And   LORazepam (ATIVAN) injection 2 mg  2 mg Intramuscular TID PRN Bobbitt, Shalon E, NP       haloperidol lactate (HALDOL) injection 10 mg  10 mg Intramuscular TID PRN Bobbitt, Shalon E, NP       And   diphenhydrAMINE (BENADRYL) injection 50 mg  50 mg Intramuscular TID PRN Bobbitt, Shalon E, NP       And   LORazepam (ATIVAN) injection 2 mg  2 mg Intramuscular TID PRN Bobbitt, Shalon E, NP       hydrOXYzine (ATARAX) tablet 25 mg  25 mg Oral TID PRN Bobbitt, Franchot Mimes, NP       [START ON 12/30/2023] levothyroxine (SYNTHROID) tablet 88 mcg  88 mcg Oral Q0600 Tomie China, MD       lurasidone (LATUDA) tablet 20 mg  20 mg Oral QHS Tomie China, MD       magnesium hydroxide (MILK OF MAGNESIA) suspension 30 mL  30 mL Oral Daily PRN Bobbitt, Shalon E, NP       traZODone (DESYREL) tablet 50 mg  50 mg Oral QHS PRN Bobbitt, Shalon E, NP   50 mg at 12/28/23 2326   PTA Medications: Medications Prior to Admission  Medication Sig Dispense Refill  Last Dose/Taking   ibuprofen (ADVIL) 200 MG tablet Take 400 mg by mouth 2 (two) times daily as needed for headache or moderate pain (pain score 4-6).      levothyroxine (SYNTHROID) 88 MCG tablet Take 88 mcg by mouth daily before breakfast.       Patient Stressors: Financial difficulties   Loss of a pregnancy d/t abortion   Substance abuse    Patient Strengths: Capable of independent living  General fund of knowledge  Supportive family/friends   Treatment Modalities: Medication Management, Group therapy, Case management,  1 to 1 session with clinician, Psychoeducation, Recreational therapy.   Physician Treatment Plan for Primary Diagnosis: Suicidal ideations Long Term Goal(s):     Short Term Goals: Ability to identify changes in lifestyle to reduce recurrence of condition will improve Ability to verbalize feelings will improve Ability to disclose and discuss suicidal ideas Ability to demonstrate self-control will improve Ability to identify and develop effective coping behaviors will improve Ability to maintain clinical measurements within normal limits will improve Compliance with prescribed medications will improve Ability to identify triggers associated with substance abuse/mental health issues will improve  Medication Management: Evaluate patient's response, side effects, and tolerance of medication regimen.  Therapeutic Interventions: 1 to 1 sessions, Unit Group sessions and Medication administration.  Evaluation of Outcomes: Not Progressing  Physician Treatment Plan for Secondary Diagnosis: Principal Problem:   Suicidal ideations  Long Term Goal(s):     Short Term Goals: Ability to identify changes in lifestyle to reduce recurrence of condition will improve Ability to verbalize feelings will improve Ability to disclose and discuss suicidal ideas Ability to demonstrate self-control will improve Ability to identify and develop effective coping behaviors will  improve Ability to maintain clinical measurements within normal limits will improve Compliance with prescribed medications will improve Ability to identify triggers associated with substance abuse/mental health issues will improve     Medication Management: Evaluate patient's response, side effects, and tolerance of medication regimen.  Therapeutic Interventions: 1 to 1 sessions, Unit Group sessions and Medication administration.  Evaluation of Outcomes: Not Progressing   RN Treatment Plan for Primary Diagnosis: Suicidal ideations Long Term Goal(s): Knowledge of disease and therapeutic regimen to maintain health will improve  Short Term Goals: Ability to remain free from injury will improve, Ability to verbalize frustration and anger appropriately will improve, Ability to demonstrate self-control, Ability to participate in decision making will improve, Ability to verbalize feelings will improve, Ability to disclose and discuss suicidal ideas, Ability to identify and develop effective coping behaviors will improve, and Compliance with prescribed medications will improve  Medication Management: RN will administer medications as ordered by provider, will assess and evaluate patient's response and provide education to patient for prescribed medication. RN will report any adverse and/or side effects to prescribing provider.  Therapeutic Interventions: 1 on 1 counseling sessions, Psychoeducation, Medication administration, Evaluate responses to treatment, Monitor vital signs and CBGs as ordered, Perform/monitor CIWA, COWS, AIMS and Fall Risk screenings as ordered, Perform wound care treatments as ordered.  Evaluation of Outcomes: Not Progressing   LCSW Treatment Plan for Primary Diagnosis: Suicidal ideations Long Term Goal(s): Safe transition to appropriate next level of care at discharge, Engage patient in therapeutic group addressing interpersonal concerns.  Short Term Goals: Engage patient in  aftercare planning with referrals and resources, Increase social support, Increase ability to appropriately verbalize feelings, Increase emotional regulation, Facilitate acceptance of mental health diagnosis and concerns, Facilitate patient progression through stages of change regarding substance use diagnoses and concerns, Identify triggers associated with mental health/substance abuse issues, and Increase skills for wellness and recovery  Therapeutic Interventions: Assess for all discharge needs, 1 to 1 time with Social worker, Explore available resources and support systems, Assess for adequacy in community support network, Educate family and significant other(s) on suicide prevention, Complete Psychosocial Assessment, Interpersonal group therapy.  Evaluation of Outcomes: Not Progressing   Progress in Treatment: Attending groups: Yes. Participating in groups: Yes. Taking medication as prescribed: None scheduled at this time Toleration medication: None scheduled at this time Family/Significant other contact made: No, will contact:  pt denied consents Patient understands diagnosis: Yes. Discussing patient identified problems/goals with staff: Yes. Medical problems stabilized or resolved: Yes. Denies suicidal/homicidal ideation: Yes. Issues/concerns per patient self-inventory: No.  New problem(s) identified: No, Describe:  none  New Short Term/Long Term Goal(s): detox, medication management for mood stabilization; elimination of SI thoughts; development of comprehensive mental wellness/sobriety plan   Patient Goals:  "How to manage my stress"  Discharge Plan or Barriers: Patient recently admitted. CSW will continue to follow and assess for appropriate referrals and possible discharge planning.    Reason  for Continuation of Hospitalization: Medication stabilization Suicidal ideation Other; describe Polysubstance abuse, lack of sleep  Estimated Length of Stay: 5-7 days  Last 3  [place] Suicide Severity Risk Score: Flowsheet Row Ad[facility] (Current) [facility]27/2023 in [facility] from 05/26/2023 in [facility])  C-SSRS RISK CATEGORY High Risk High Risk No Risk       Last PHQ 2/9 Scores:     No data to display          Scribe for Treatment Team: Kathi Der, LCSWA 12/29/2023 1:57 PM

## 2023-12-30 LAB — LIPID PANEL
Cholesterol: 178 mg/dL (ref 0–200)
HDL: 59 mg/dL (ref >40)
LDL Cholesterol: 101 mg/dL — ABNORMAL HIGH (ref 0–99)
Total CHOL/HDL Ratio: 3 {ratio}
Triglycerides: 92 mg/dL (ref <150)
VLDL: 18 mg/dL (ref 0–40)

## 2023-12-30 LAB — RPR: RPR Ser Ql: NONREACTIVE

## 2023-12-30 LAB — HEMOGLOBIN A1C
Hgb A1c MFr Bld: 4.7 % — ABNORMAL LOW (ref 4.8–5.6)
Mean Plasma Glucose: 88.19 mg/dL

## 2023-12-30 MED ORDER — LURASIDONE HCL 20 MG PO TABS
20.0000 mg | ORAL_TABLET | Freq: Every evening | ORAL | Status: DC
  Administered 2023-12-30 – 2023-12-31 (×2): 20 mg via ORAL
  Filled 2023-12-30 (×4): qty 1

## 2023-12-30 MED ORDER — VITAMIN D3 25 MCG PO TABS
2000.0000 [IU] | ORAL_TABLET | Freq: Every day | ORAL | Status: DC
  Administered 2023-12-30 – 2024-01-01 (×3): 2000 [IU] via ORAL
  Filled 2023-12-30 (×6): qty 2

## 2023-12-30 NOTE — Group Note (Signed)
Recreation Therapy Group Note   Group Topic:Animal Assisted Therapy   Group Date: 12/30/2023 Start Time: 4098 End Time: 1030 Facilitators: Jveon Pound-McCall, LRT,CTRS Location: [address] Dayroom   Animal-Assisted Activity (AAA) Program Checklist/Progress Notes Patient Eligibility Criteria Checklist & Daily Group note for Rec Tx Intervention  AAA/T Program Assumption of Risk Form signed by Patient/ or Parent Legal Guardian Yes  Patient understands his/her participation is voluntary Yes  Education: Charity fundraiser, Appropriate Animal Interaction   Education Outcome: Acknowledges education.    Affect/Mood: N/A   Participation Level: Did not attend    Clinical Observations/Individualized Feedback:    Plan: Continue to engage patient in RT group sessions 2-3x/week.   Rhen Kawecki-McCall, LRT,CTRS 12/30/2023 1:08 PM

## 2023-12-30 NOTE — Progress Notes (Signed)
Patient is irritable this morning prn Vistaril given. Refused blood draw. Support and encouragement provided.

## 2023-12-30 NOTE — [facility] Group Notes (Signed)
[facility] Group Notes:  (Nursing/MHT/Case Management/Adjunct)  Date:  12/30/2023  Time:  10:04 PM  Type of Therapy:  Psychoeducational Skills  Participation Level:  Active  Participation Quality:  Appropriate  Affect:  Appropriate  Cognitive:  Appropriate  Insight:  Appropriate  Engagement in Group:  Developing/Improving  Modes of Intervention:  Education  Summary of Progress/Problems: The patient rated her day as a 7.5 out of 10. Patient states that she rested quite a bit today. Her goal for tomorrow is to "stay positive".   Hazle Coca S 12/30/2023, 10:04 PM
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
## 2023-12-30 NOTE — Plan of Care (Signed)
  Problem: Activity: Goal: Sleeping patterns will improve Outcome: Progressing   Problem: Physical Regulation: Goal: Ability to maintain clinical measurements within normal limits will improve Outcome: Progressing   Problem: Safety: Goal: Periods of time without injury will increase Outcome: Progressing   

## 2023-12-30 NOTE — Group Note (Signed)
LCSW Group Therapy Note   Group Date: 12/30/2023 Start Time: 1100 End Time: 1200   Type of Therapy:  Group Therapy  Topic:  Shining from Within:  Confidence and Self-Love Journey  Participation:  did not attend   Objective: Promote Self-Awareness and Realistic Self-Talk: Help participants recognize their strengths and replace negative thoughts with truthful, realistic statements to build confidence.  Goals: Increase Confidence: Help participants develop a positive self-image by focusing on their strengths and personal progress. Set Achievable Goals: Guide participants in creating small, realistic goals that foster a sense of accomplishment and build momentum. Enhance Self-Care Practices: Encourage participants to incorporate self-care activities into their routine to support emotional well-being and reinforce confidence.  Summary: The "Shining from Within: Confidence and Self-Love Journey" group helped individuals build stronger self-confidence through truthful, realistic self-talk, goal-setting, and self-care practices. Participants recognized their unique strengths, set achievable goals, and nurtured a supportive environment for mutual growth. The group provided practical tools to foster lasting confidence and self-belief, empowering each member to shine from within and embrace their personal power with greater self-assurance.  Therapeutic Modalities: Elements of CBT Elements of DBT  Alla Feeling, LCSWA 12/30/2023  6:20 PM

## 2023-12-30 NOTE — Progress Notes (Signed)
   12/30/23 0825  Psych Admission Type (Psych Patients Only)  Admission Status Involuntary  Psychosocial Assessment  Patient Complaints Irritability  Eye Contact Fair  Facial Expression Anxious;Flat  Affect Anxious;Depressed;Irritable  Speech Logical/coherent  Interaction Assertive  Motor Activity Other (Comment) (WNL)  Appearance/Hygiene Disheveled  Behavior Characteristics Anxious;Irritable  Mood Depressed;Anxious;Irritable  Thought Process  Coherency WDL  Content WDL  Delusions None reported or observed  Perception WDL  Hallucination None reported or observed  Judgment Impaired  Confusion None  Danger to Self  Current suicidal ideation? Denies  Agreement Not to Harm Self Yes  Description of Agreement Verbal  Danger to Others  Danger to Others None reported or observed

## 2023-12-30 NOTE — Plan of Care (Signed)
  Problem: Education: Goal: Knowledge of [facility] Education information/materials will improve Outcome: Progressing Goal: Emotional status will improve Outcome: Progressing Goal: Mental status will improve Outcome: Progressing Goal: Verbalization of understanding the information provided will improve Outcome: Progressing  Patient denies SI/HI/A/VH and verbally contracts for safety. Endorses anxiety and sleep disturbances Prn Vistaril and Trazodone given at HS and effective. Support ongoig.

## 2023-12-31 DIAGNOSIS — F1590 Other stimulant use, unspecified, uncomplicated: Secondary | ICD-10-CM | POA: Insufficient documentation

## 2023-12-31 DIAGNOSIS — F1410 Cocaine abuse, uncomplicated: Secondary | ICD-10-CM | POA: Insufficient documentation

## 2023-12-31 DIAGNOSIS — F1090 Alcohol use, unspecified, uncomplicated: Secondary | ICD-10-CM | POA: Insufficient documentation

## 2023-12-31 DIAGNOSIS — F1290 Cannabis use, unspecified, uncomplicated: Secondary | ICD-10-CM | POA: Insufficient documentation

## 2023-12-31 LAB — T3: T3, Total: 70 ng/dL — ABNORMAL LOW (ref 71–180)

## 2023-12-31 NOTE — [facility] Group Notes (Signed)
[facility] Group Notes:  (Nursing/MHT/Case Management/Adjunct)  Date:  12/31/2023  Time:  9:27 PM  Type of Therapy:   NA group  Participation Level:  Active  Participation Quality:  Appropriate  Affect:  Appropriate  Cognitive:  Appropriate  Insight:  Appropriate  Engagement in Group:  Engaged  Modes of Intervention:  Education  Summary of Progress/Problems: Pt attended NA meeting. Pt was very tearful during the meeting.   Noah Delaine 12/31/2023, 9:27 PM
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
## 2023-12-31 NOTE — Plan of Care (Signed)
  Problem: Education: Goal: Knowledge of  General Education information/materials will improve Outcome: Progressing   Problem: Coping: Goal: Ability to demonstrate self-control will improve 12/31/2023 0410 by Villa Herb, RN Outcome: Progressing 12/31/2023 0409 by Villa Herb, RN Outcome: Progressing   Problem: Health Behavior/Discharge Planning: Goal: Compliance with treatment plan for underlying cause of condition will improve Outcome: Progressing Note: Pt stated, "I'm tired, I need to get used to my meds."

## 2023-12-31 NOTE — Group Note (Signed)
Date:  12/31/2023 Time:  4:39 PM  Group Topic/Focus:  Dimensions of Wellness:   The focus of this group is to introduce the topic of wellness and discuss the role each dimension of wellness plays in total health.    Participation Level:  Active  Participation Quality:  Appropriate  Affect:  Appropriate  Cognitive:  Appropriate  Insight: Appropriate  Engagement in Group:  Engaged  Modes of Intervention:  Discussion and Education  Additional Comments:   Pt attended and participated in the Physical Wellness education group. Pt was calm, cooperative and attentive throughout the group.  Edmund Hilda Dorcus Riga 12/31/2023, 4:39 PM

## 2023-12-31 NOTE — Progress Notes (Signed)
   12/31/23 0845  Psych Admission Type (Psych Patients Only)  Admission Status Involuntary  Psychosocial Assessment  Patient Complaints Irritability;Sleep disturbance  Eye Contact Fair  Facial Expression Anxious  Affect Anxious  Speech Logical/coherent  Interaction Assertive  Appearance/Hygiene Disheveled;Poor hygiene;In scrubs  Behavior Characteristics Guarded  Mood Irritable  Thought Process  Coherency WDL  Content WDL  Delusions None reported or observed  Perception WDL  Hallucination None reported or observed  Judgment Impaired  Confusion None  Danger to Self  Current suicidal ideation? Denies  Agreement Not to Harm Self Yes  Description of Agreement Verbal  Danger to Others  Danger to Others None reported or observed
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
## 2023-12-31 NOTE — Group Note (Signed)
Date:  12/31/2023 Time:  10:21 AM  Group Topic/Focus:  Goals Group:   The focus of this group is to help patients establish daily goals to achieve during treatment and discuss how the patient can incorporate goal setting into their daily lives to aide in recovery. Orientation:   The focus of this group is to educate the patient on the purpose and policies of crisis stabilization and provide a format to answer questions about their admission.  The group details unit policies and expectations of patients while admitted.    Participation Level:  Active  Participation Quality:  Appropriate  Affect:  Appropriate  Cognitive:  Appropriate  Insight: Appropriate  Engagement in Group:  Engaged  Modes of Intervention:  Discussion, Orientation, and Rapport Building  Additional Comments:   Pt attended and actively participated in the Orientation/Goals group. Pt was calm and cooperative throughout the group. Pt personal goal for today is to attend all scheduled groups.  Dana Fisher 12/31/2023, 10:21 AM

## 2023-12-31 NOTE — Plan of Care (Signed)
On collateral call with mother, [name] [phone]): Amenable to plan to discharge home tomorrow. Feels that patient has been a little "spaced out" but otherwise improved. Plan is for mother to pick patient up at 1:00 p.m. if patient continues to improve. Believes that boyfriend will let patient return to live with him if she completes inpatient substance use therapy at [facility]. No acute safety concerns. Confirms no firearms at boyfriends place.   On collateral call with boyfriend, Liston Alba [phone]): on board with current plan to discharge patient tomorrow 1/23 with goal to enter [facility] Friday morning. Per boyfriend, it "feels like patient needs some time to get clean." Planning on seeing patient this evening. Writer emphasized importance of setting clear boundaries for returning home to boyfriend and child. This will depend on her successfully completing [facility] recovery program. Patient seems "kind of numb" to everything -- doesn't react very strongly, one way or the other, but in good spirits, all things considered. Main worry: know that she wants to stay home and have a few days. Worried she will push it off until Monday. Good to get home for just a little bit -- want to make sure the importance of going through with it. However, patient "lies a lot, that's my biggest thing. My trust is a little broken right now." Does not believe that the suicidal threat was genuine or that patient would kill herself if released. Firearms present in home, but agrees to move guns out of the house before patient returns home for safety.

## 2023-12-31 NOTE — Group Note (Signed)
Recreation Therapy Group Note   Group Topic:Other  Group Date: 12/31/2023 Start Time: 1405 End Time: 1445 Facilitators: Kazue Cerro-McCall, LRT,CTRS Location: [address] Dayroom   Activity Description/Intervention: Therapeutic Drumming. Patients with peers and staff were given the opportunity to engage in a leader facilitated [facility] Group Empowerment Drumming Circle with staff from the [employer], in partnership with [employer]. Teaching laboratory technician and trained [employer], Theodoro Doing leading with LRT observing and documenting intervention and pt response. This evidenced-based practice targets 7 areas of health and wellbeing in the human experience including: stress-reduction, exercise, self-expression, camaraderie/support, nurturing, spirituality, and music-making (leisure).   Goal Area(s) Addresses:  Patient will engage in pro-social way in music group.  Patient will follow directions of drum leader on the first prompt. Patient will demonstrate no behavioral issues during group.  Patient will identify if a reduction in stress level occurs as a result of participation in therapeutic drum circle.    Education: Leisure exposure, Pharmacologist, Musical expression, Discharge Planning   Affect/Mood: N/A   Participation Level: Did not attend    Clinical Observations/Individualized Feedback:     Plan: Continue to engage patient in RT group sessions 2-3x/week.   Anevay Campanella-McCall, LRT,CTRS 12/31/2023 3:20 PM

## 2023-12-31 NOTE — Progress Notes (Signed)
   12/30/23 2100  Psych Admission Type (Psych Patients Only)  Admission Status Involuntary  Psychosocial Assessment  Patient Complaints Irritability;Sleep disturbance ("I'm trying to sleep.")  Eye Contact Fair  Facial Expression Other (Comment) (Strained, tense)  Affect Other (Comment);Constricted (feined interest)  Speech Other (Comment) (short answers/minimal)  Interaction Assertive  Appearance/Hygiene Disheveled  Behavior Characteristics Unwilling to participate;Guarded;Irritable  Mood Irritable ("I just want to go to sleep")  Thought Process  Coherency WDL  Content WDL  Delusions None reported or observed  Perception WDL  Hallucination None reported or observed  Judgment Impaired  Confusion None  Danger to Self  Current suicidal ideation? Denies  Agreement Not to Harm Self Yes  Description of Agreement Verbal  Danger to Others  Danger to Others None reported or observed

## 2023-12-31 NOTE — Plan of Care (Signed)
?  Problem: Education: ?Goal: Mental status will improve ?Outcome: Progressing ?Goal: Verbalization of understanding the information provided will improve ?Outcome: Progressing ?  ?

## 2024-01-01 ENCOUNTER — Other Ambulatory Visit (HOSPITAL_COMMUNITY): Payer: Self-pay

## 2024-01-01 MED ORDER — LURASIDONE HCL 20 MG PO TABS: 20.0000 mg | ORAL_TABLET | Freq: Every evening | ORAL | 0 refills | Status: AC

## 2024-01-01 MED ORDER — LEVOTHYROXINE SODIUM 88 MCG PO TABS: 88.0000 ug | ORAL_TABLET | Freq: Every day | ORAL | 0 refills | Status: AC

## 2024-01-01 NOTE — [facility] Group Notes (Signed)

## 2024-01-01 NOTE — Progress Notes (Signed)
   01/01/24 0000  Psych Admission Type (Psych Patients Only)  Admission Status Involuntary  Psychosocial Assessment  Patient Complaints Sleep disturbance;Irritability  Eye Contact Fair  Facial Expression Anxious  Affect Anxious  Speech Logical/coherent  Interaction Assertive  Motor Activity Other (Comment) (WDL)  Appearance/Hygiene Poor hygiene  Behavior Characteristics Guarded  Mood Irritable  Thought Process  Coherency WDL  Content WDL  Delusions None reported or observed  Perception WDL  Hallucination None reported or observed  Judgment Impaired  Confusion None  Danger to Self  Current suicidal ideation? Denies  Agreement Not to Harm Self Yes  Description of Agreement verbal  Danger to Others  Danger to Others None reported or observed

## 2024-01-01 NOTE — Plan of Care (Signed)

## 2024-01-01 NOTE — Progress Notes (Signed)
Patient is discharging at this time. Patient is A&Ox4. Stable. Patient denies SI,HI, and A/V/H with no plan/intent. Printed AVS reviewed with and given to patient along with medications and follow up appointments. Suicide safety plan complete with copy provided to patient. Survey complete. Original form in chart. Patient verbalized all understanding. All valuables/belongings returned to patient. Patient is being transported by her mother. Patient denies any pain/discomfort. No s/s of current distress.

## 2024-01-01 NOTE — Group Note (Signed)
Date:  01/01/2024 Time:  10:54 AM  Group Topic/Focus:  Goals Group:   The focus of this group is to help patients establish daily goals to achieve during treatment and discuss how the patient can incorporate goal setting into their daily lives to aide in recovery. Orientation:   The focus of this group is to educate the patient on the purpose and policies of crisis stabilization and provide a format to answer questions about their admission.  The group details unit policies and expectations of patients while admitted.    Participation Level:  Active  Participation Quality:  Attentive  Affect:  Appropriate  Cognitive:  Appropriate  Insight: Appropriate  Engagement in Group:  Engaged  Modes of Intervention:  Discussion  Additional Comments:  Patient attended goals group and was attentive the duration of it. Patient's goal was to come up with a discharge plan.  [name] T [name] 01/01/2024, 10:54 AM

## 2024-01-01 NOTE — Progress Notes (Signed)
  [facility] Adult Case Management Discharge Plan :  Will you be returning to the same living situation after discharge:  Yes,  pt will be returning home at discharge At discharge, do you have transportation home?: Yes,  pt will be picked up at 1:00PM by mother Do you have the ability to pay for your medications: Yes,  pt has active [place] Medicaid PHP  Release of information consent forms completed and in the chart;  Patient's signature needed at discharge.  Patient to Follow up at:  Follow-up Information     [facility] Follow up on 01/08/2024.   Why: You have a hospital follow up appointment for therapy and medication management services on 01/08/24 at 11:30 am.  The appointment will be Virtual telehealth. Contact information: [address]  [address] [place] [address] [phone]                 Next level of care provider has access to [facility] Link:no  Safety Planning and Suicide Prevention discussed: No. Pt declined consents     Has patient been referred to the Quitline?: Patient does not use tobacco/nicotine products per patient report this morning.  Patient has been referred for addiction treatment: Patient refused referral for treatment. Pt reports that she will go home for 1 day and then will admit herself to [facility] on Friday. Pt did not want to go door-to-door for treatment.  Kathi Der, LCSWA 01/01/2024, 9:18 AM

## 2024-01-12 ENCOUNTER — Emergency Department (HOSPITAL_BASED_OUTPATIENT_CLINIC_OR_DEPARTMENT_OTHER): Payer: Medicaid Other

## 2024-01-12 ENCOUNTER — Encounter (HOSPITAL_BASED_OUTPATIENT_CLINIC_OR_DEPARTMENT_OTHER): Payer: Self-pay | Admitting: Urology

## 2024-01-12 ENCOUNTER — Emergency Department (HOSPITAL_BASED_OUTPATIENT_CLINIC_OR_DEPARTMENT_OTHER)
Admission: EM | Admit: 2024-01-12 | Discharge: 2024-01-12 | Disposition: A | Discharge status: Home / Self Care | Payer: Medicaid Other | Attending: Emergency Medicine | Admitting: Emergency Medicine

## 2024-01-12 ENCOUNTER — Other Ambulatory Visit: Payer: Self-pay

## 2024-01-12 DIAGNOSIS — R42 Dizziness and giddiness: Secondary | ICD-10-CM | POA: Diagnosis present

## 2024-01-12 DIAGNOSIS — F419 Anxiety disorder, unspecified: Secondary | ICD-10-CM | POA: Insufficient documentation

## 2024-01-12 DIAGNOSIS — R202 Paresthesia of skin: Secondary | ICD-10-CM | POA: Diagnosis not present

## 2024-01-12 DIAGNOSIS — Z79899 Other long term (current) drug therapy: Secondary | ICD-10-CM | POA: Diagnosis not present

## 2024-01-12 LAB — CBC
HCT: 38.7 % (ref 36.0–46.0)
Hemoglobin: 13.2 g/dL (ref 12.0–15.0)
MCH: 30.3 pg (ref 26.0–34.0)
MCHC: 34.1 g/dL (ref 30.0–36.0)
MCV: 89 fL (ref 80.0–100.0)
Platelets: 291 10*3/uL (ref 150–400)
RBC: 4.35 MIL/uL (ref 3.87–5.11)
RDW: 12.3 % (ref 11.5–15.5)
WBC: 9.5 10*3/uL (ref 4.0–10.5)
nRBC: 0 % (ref 0.0–0.2)

## 2024-01-12 LAB — BASIC METABOLIC PANEL
Anion gap: 10 (ref 5–15)
BUN: 11 mg/dL (ref 6–20)
CO2: 22 mmol/L (ref 22–32)
Calcium: 9.1 mg/dL (ref 8.9–10.3)
Chloride: 105 mmol/L (ref 98–111)
Creatinine, Ser: 0.62 mg/dL (ref 0.44–1.00)
GFR, Estimated: >60 mL/min (ref >60)
Glucose, Bld: 120 mg/dL — ABNORMAL HIGH (ref 70–99)
Potassium: 3.5 mmol/L (ref 3.5–5.1)
Sodium: 137 mmol/L (ref 135–145)

## 2024-01-12 LAB — TROPONIN I (HIGH SENSITIVITY): Troponin I (High Sensitivity): <2 ng/L (ref <18)

## 2024-01-12 LAB — PREGNANCY, URINE: Preg Test, Ur: NEGATIVE

## 2024-01-12 NOTE — Discharge Instructions (Addendum)
Please read and follow all provided instructions.  Your diagnoses today include:  1. Episodic lightheadedness   2. Paresthesia     Tests performed today include: Complete blood cell count: Was normal Basic metabolic panel: Slightly high blood sugar otherwise no issues Cardiac enzymes (blood test looking for stress on the heart): Was normal, no signs of stress on the heart EKG: Was normal with normal rhythm Chest x-ray: Was clear, no signs of infection or enlarged heart Pregnancy test (urine or blood, in women only): Negative Vital signs. See below for your results today.   Medications prescribed:  None  Take any prescribed medications only as directed.  Home care instructions:  Follow any educational materials contained in this packet.  BE VERY CAREFUL not to take multiple medicines containing Tylenol (also called acetaminophen). Doing so can lead to an overdose which can damage your liver and cause liver failure and possibly death.   Follow-up instructions: Please follow-up with your primary care provider as needed for further evaluation of your symptoms.   Return instructions:  Please return to the Emergency Department if you experience worsening symptoms.  Please return if you have any other emergent concerns.  Additional Information:  Your vital signs today were: BP 104/73 (BP Location: Right Arm)   Pulse 81   Temp 98.9 F (37.2 C) (Oral)   Resp 16   Ht 5\' 2"  (1.575 m)   Wt 63.5 kg   LMP 12/14/2023 (Approximate)   SpO2 100%   BMI 25.61 kg/m  If your blood pressure (BP) was elevated above 135/85 this visit, please have this repeated by your doctor within one month. --------------

## 2024-01-12 NOTE — ED Provider Notes (Cosign Needed)
[facility] [place] [facility] DEPARTMENT AT [facility] Provider Note   CSN: [id number] Arrival date & time: 01/12/24  1329     History  Chief Complaint  Patient presents with   Multiple Complaints     Dana Fisher is a 28 y.o. female.  Patient presents to the emergency department today for evaluation of intermittent lightheadedness and tingling in her hands and feet.  Patient has been having symptoms for about 1 week.  These seem to be worse in the evenings around dinnertime.  Patient was admitted to the hospital for severe alcohol withdrawal and also cocaine withdrawal.  She has been using daily alcohol for about a year.  She has been clean since 1/17.  Patient was then sent to behavioral health and is now at [facility] for detox.  She reports mild anxiety with the symptoms.  She did start taking Latuda about 2 weeks ago.  Denies history of blood sugar problems or diabetes.  No severe headache.  She has occasional episodes of vomiting, last episode 3 days ago.  No abdominal pain.       Home Medications Prior to Admission medications   Medication Sig Start Date End Date Taking? Authorizing Provider  ibuprofen (ADVIL) 200 MG tablet Take 400 mg by mouth 2 (two) times daily as needed for headache or moderate pain (pain score 4-6).    [provider]  levothyroxine (SYNTHROID) 88 MCG tablet Take 1 tablet (88 mcg total) by mouth daily before breakfast. 01/01/24   Tomie China, MD  lurasidone (LATUDA) 20 MG TABS tablet Take 1 tablet (20 mg total) by mouth every evening. 01/01/24   Tomie China, MD      Allergies    Ms contin [morphine]    Review of Systems   Review of Systems  Physical Exam Updated Vital Signs BP 104/73 (BP Location: Right Arm)   Pulse 81   Temp 98.9 F (37.2 C) (Oral)   Resp 16   Ht 5\' 2"  (1.575 m)   Wt 63.5 kg   LMP 12/14/2023 (Approximate)   SpO2 100%   BMI 25.61 kg/m   Physical Exam Vitals and nursing note reviewed.   Constitutional:      Appearance: She is well-developed. She is not diaphoretic.  HENT:     Head: Normocephalic and atraumatic.     Right Ear: External ear normal.     Left Ear: External ear normal.     Mouth/Throat:     Mouth: Mucous membranes are not dry.  Eyes:     Conjunctiva/sclera: Conjunctivae normal.  Neck:     Vascular: Normal carotid pulses. No JVD.     Trachea: Trachea normal. No tracheal deviation.  Cardiovascular:     Rate and Rhythm: Normal rate and regular rhythm.     Pulses: No decreased pulses.          Radial pulses are 2+ on the right side and 2+ on the left side.     Heart sounds: Normal heart sounds, S1 normal and S2 normal. No murmur heard. Pulmonary:     Effort: Pulmonary effort is normal. No respiratory distress.     Breath sounds: No wheezing.  Chest:     Chest wall: No tenderness.  Abdominal:     General: Bowel sounds are normal.     Palpations: Abdomen is soft.     Tenderness: There is no abdominal tenderness. There is no guarding or rebound.  Musculoskeletal:        General: Normal  range of motion.     Cervical back: Normal range of motion and neck supple. No muscular tenderness.  Skin:    General: Skin is warm and dry.     Coloration: Skin is not pale.  Neurological:     Mental Status: She is alert.     Cranial Nerves: Cranial nerves 2-12 are intact.     Sensory: Sensation is intact.     Motor: Motor function is intact.     Coordination: Coordination is intact.     ED Results / Procedures / Treatments   Labs (all labs ordered are listed, but only abnormal results are displayed) Labs Reviewed  BASIC METABOLIC PANEL - Abnormal; Notable for the following components:      Result Value   Glucose, Bld 120 (*)    All other components within normal limits  CBC  PREGNANCY, URINE  TROPONIN I (HIGH SENSITIVITY)    EKG EKG Interpretation Date/Time:  Monday January 12 2024 13:39:10 EST Ventricular Rate:  90 PR Interval:  174 QRS  Duration:  81 QT Interval:  371 QTC Calculation: 454 R Axis:   81  Text Interpretation: Sinus rhythm No significant change since last tracing Confirmed by Alvira Monday ([id number]) on 01/12/2024 4:19:08 PM  Radiology DG Chest 2 View Result Date: 01/12/2024 CLINICAL DATA:  Chest tightness EXAM: CHEST - 2 VIEW COMPARISON:  None Available. FINDINGS: The heart size and mediastinal contours are within normal limits. Both lungs are clear. The visualized skeletal structures are unremarkable. IMPRESSION: No active cardiopulmonary disease. Electronically Signed   By: Darliss Cheney M.D.   On: 01/12/2024 15:26    Procedures Procedures    Medications Ordered in ED Medications - No data to display  ED Course/ Medical Decision Making/ A&P    Patient seen and examined. History obtained directly from patient.  Reviewed recent behavioral health notes.  Labs/EKG: Independently reviewed and interpreted.  This included: CBC unremarkable with normal hemoglobin; BMP with slightly elevated glucose at 120 otherwise no electrolyte abnormalities; troponin less than 2; pregnancy negative.  EKG personally reviewed and interpreted as above.  Imaging: Independently visualized and interpreted.  This included: Chest x-ray two-view, negative.  Medications/Fluids: None ordered  Most recent vital signs reviewed and are as follows: BP 104/73 (BP Location: Right Arm)   Pulse 81   Temp 98.9 F (37.2 C) (Oral)   Resp 16   Ht 5\' 2"  (1.575 m)   Wt 63.5 kg   LMP 12/14/2023 (Approximate)   SpO2 100%   BMI 25.61 kg/m   Initial impression: Lightheadedness with reassuring workup.    Plan: Discharge back to [facility].  Prescriptions written for: None  Other home care instructions discussed: Monitoring of symptoms  ED return instructions discussed: Patient counseled to return if they have weakness in their arms or legs, slurred speech, trouble walking or talking, confusion, trouble with their balance, or if they have  any other concerns. Patient verbalizes understanding and agrees with plan.   Follow-up instructions discussed: Patient encouraged to follow-up with their PCP in 7 days.                                 Medical Decision Making Amount and/or Complexity of Data Reviewed Labs: ordered. Radiology: ordered.    Patient with lightheadedness and some paresthesias.  Possible anxiety reaction/hyperventilation.  EKG is normal without arrhythmia, prolonged QT, heart block, WPW, Brugada syndrome.  Recent cessation of substance use  and some new medications.  No focal neurologic deficits.  No full syncope.  No chest pain.  She is low risk.  Will discharge back to her rehab facility.  The patient's vital signs, pertinent lab work and imaging were reviewed and interpreted as discussed in the ED course. Hospitalization was considered for further testing, treatments, or serial exams/observation. However as patient is well-appearing, has a stable exam, and reassuring studies today, I do not feel that they warrant admission at this time. This plan was discussed with the patient who verbalizes agreement and comfort with this plan and seems reliable and able to return to the Emergency Department with worsening or changing symptoms.          Final Clinical Impression(s) / ED Diagnoses Final diagnoses:  Episodic lightheadedness  Paresthesia    Rx / DC Orders ED Discharge Orders     None         Renne Crigler, PA-C 01/12/24 1725

## 2024-01-12 NOTE — ED Triage Notes (Signed)
Pt states lightheaded and dizziness worse around the times she eats, states tingling in feet and hands Very anxious at time of triage, pressured speech  BP was [facility] at [facility]    Detox from ETOH and Cocaine  Last use was 1/17

## 2024-01-12 NOTE — ED Notes (Signed)
 Patient transported to X-ray

## 2024-01-26 ENCOUNTER — Encounter: Payer: Self-pay | Admitting: Physician Assistant

## 2024-01-26 ENCOUNTER — Ambulatory Visit: Payer: Medicaid Other | Admitting: Physician Assistant

## 2024-01-26 VITALS — BP 113/91 | HR 86 | Temp 98.7°F | Ht 62.0 in | Wt 146.0 lb

## 2024-01-26 DIAGNOSIS — F1410 Cocaine abuse, uncomplicated: Secondary | ICD-10-CM

## 2024-01-26 DIAGNOSIS — Z112 Encounter for screening for other bacterial diseases: Secondary | ICD-10-CM

## 2024-01-26 DIAGNOSIS — E039 Hypothyroidism, unspecified: Secondary | ICD-10-CM

## 2024-01-26 DIAGNOSIS — R300 Dysuria: Secondary | ICD-10-CM

## 2024-01-26 DIAGNOSIS — E559 Vitamin D deficiency, unspecified: Secondary | ICD-10-CM

## 2024-01-26 DIAGNOSIS — F1010 Alcohol abuse, uncomplicated: Secondary | ICD-10-CM | POA: Diagnosis not present

## 2024-01-26 DIAGNOSIS — J020 Streptococcal pharyngitis: Secondary | ICD-10-CM

## 2024-01-26 DIAGNOSIS — Z113 Encounter for screening for infections with a predominantly sexual mode of transmission: Secondary | ICD-10-CM

## 2024-01-26 DIAGNOSIS — Z1152 Encounter for screening for COVID-19: Secondary | ICD-10-CM

## 2024-01-26 LAB — POCT URINALYSIS DIP (CLINITEK)
Bilirubin, UA: NEGATIVE
Blood, UA: NEGATIVE
Glucose, UA: NEGATIVE mg/dL
Ketones, POC UA: NEGATIVE mg/dL
Leukocytes, UA: NEGATIVE
Nitrite, UA: NEGATIVE
POC PROTEIN,UA: NEGATIVE
Spec Grav, UA: 1.02 (ref 1.010–1.025)
Urobilinogen, UA: 0.2 U/dL
pH, UA: 7 (ref 5.0–8.0)

## 2024-01-26 LAB — POCT RAPID STREP A (OFFICE): Rapid Strep A Screen: POSITIVE — AB

## 2024-01-26 LAB — POC COVID19 BINAXNOW: SARS Coronavirus 2 Ag: NEGATIVE

## 2024-01-26 MED ORDER — AMOXICILLIN-POT CLAVULANATE 875-125 MG PO TABS: 1.0000 | ORAL_TABLET | Freq: Two times a day (BID) | ORAL | 0 refills | Status: DC
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
## 2024-01-26 NOTE — Patient Instructions (Signed)
VISIT SUMMARY:  During your visit today, we addressed your urinary symptoms, sore throat, and your request for STD screening. We discussed your symptoms, performed necessary tests, and created a plan to help you feel better and maintain your health.  YOUR PLAN:  -URINARY SYMPTOMS:  Your urinalysis did not show a urinary tract infection. Continue drinking plenty of water and reducing your sugar intake. Keep an eye on your symptoms and let us know if they change or worsen.  -STREPTOCOCCAL PHARYNGITIS: You have strep throat, which is a bacterial infection causing a sore throat and feverish feeling. We have prescribed Augmentin to treat the infection. You can also take Tylenol and ibuprofen to help relieve your symptoms.  -STD SCREENING:. We will communicate the results to you via MyChart and a phone call.  -GENERAL HEALTH MAINTENANCE: We have scheduled a primary care appointment for you at [facility] on March 14th at 10:20 AM to establish care and plan for a future well woman exam. This will help [place] keep track of your overall health and address any ongoing concerns.  INSTRUCTIONS:  Please continue to monitor your urinary symptoms and follow the prescribed treatment for strep throat. Attend your primary care appointment on March 14th at 10:20 AM at [facility]. We will inform you of your STD screening results via MyChart and a phone call.

## 2024-01-27 ENCOUNTER — Other Ambulatory Visit: Payer: Self-pay | Admitting: Physician Assistant

## 2024-01-27 DIAGNOSIS — F1010 Alcohol abuse, uncomplicated: Secondary | ICD-10-CM | POA: Insufficient documentation

## 2024-01-27 DIAGNOSIS — E039 Hypothyroidism, unspecified: Secondary | ICD-10-CM | POA: Insufficient documentation

## 2024-01-27 DIAGNOSIS — E559 Vitamin D deficiency, unspecified: Secondary | ICD-10-CM | POA: Insufficient documentation

## 2024-01-29 LAB — SPECIMEN STATUS REPORT

## 2024-01-29 LAB — URINE CULTURE

## 2024-02-01 LAB — SPECIMEN STATUS REPORT

## 2024-02-01 LAB — NUSWAB VAGINITIS PLUS (VG+)
Candida albicans, NAA: NEGATIVE
Candida glabrata, NAA: NEGATIVE
Chlamydia trachomatis, NAA: NEGATIVE
Neisseria gonorrhoeae, NAA: NEGATIVE
Trich vag by NAA: NEGATIVE

## 2024-02-16 ENCOUNTER — Ambulatory Visit
Admission: RE | Admit: 2024-02-16 | Discharge: 2024-02-16 | Disposition: A | Discharge status: Home / Self Care | Source: Ambulatory Visit

## 2024-02-16 ENCOUNTER — Other Ambulatory Visit: Payer: Self-pay

## 2024-02-16 VITALS — BP 116/69 | HR 73 | Temp 99.0°F | Resp 18

## 2024-02-16 DIAGNOSIS — J3489 Other specified disorders of nose and nasal sinuses: Secondary | ICD-10-CM | POA: Diagnosis not present

## 2024-02-16 DIAGNOSIS — J22 Unspecified acute lower respiratory infection: Secondary | ICD-10-CM

## 2024-02-16 MED ORDER — DOXYCYCLINE HYCLATE 100 MG PO CAPS: 100.0000 mg | ORAL_CAPSULE | Freq: Two times a day (BID) | ORAL | 0 refills | Status: AC

## 2024-02-16 NOTE — ED Triage Notes (Signed)
 Pt here for facial pain, sinus pressure; nasal congestion and sore throat x 10 days

## 2024-02-16 NOTE — Discharge Instructions (Signed)
 Take doxycycline as prescribed, drink plenty of water, follow-up with PCP, return as needed.  May take over-the-counter meds for symptom management.
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
## 2024-02-20 ENCOUNTER — Ambulatory Visit (INDEPENDENT_AMBULATORY_CARE_PROVIDER_SITE_OTHER): Payer: Medicaid Other | Admitting: Family

## 2024-02-20 VITALS — BP 100/68 | HR 76 | Temp 97.9°F | Ht 62.0 in | Wt 144.0 lb

## 2024-02-20 DIAGNOSIS — Z09 Encounter for follow-up examination after completed treatment for conditions other than malignant neoplasm: Secondary | ICD-10-CM | POA: Diagnosis not present

## 2024-02-20 DIAGNOSIS — Z7689 Persons encountering health services in other specified circumstances: Secondary | ICD-10-CM

## 2024-02-20 NOTE — Progress Notes (Signed)
 Subjective:    Dana Fisher - 28 y.o. female MRN [id number]  Date of birth: 10-23-1996  HPI  Dana Fisher is to establish care.   Current issues and/or concerns: - Feeling improved since recent Urgent Care visit.  - No issues/concerns for discussion today.  ROS per HPI    Health Maintenance:  Health Maintenance Due  Topic Date Due   Pneumococcal Vaccine 96-44 Years old (1 of 2 - PCV) Never done   HPV VACCINES (2 - 2-dose series) 12/21/2011   DTaP/Tdap/Td (9 - Td or Tdap) 11/08/2020   Cervical Cancer Screening (Pap smear)  01/12/2022   INFLUENZA VACCINE  Never done   COVID-19 Vaccine (1 - 2024-25 season) Never done     Past Medical History: Patient Active Problem List   Diagnosis Date Noted   Sinus pressure 02/16/2024   Acute respiratory infection 02/16/2024   Hypothyroidism 01/27/2024   Vitamin D deficiency 01/27/2024   Alcohol abuse 01/27/2024   Alcohol use disorder 12/31/2023   Cocaine abuse (HCC) 12/31/2023   Cannabis use disorder 12/31/2023   Suicidal ideations 12/28/2023   Polysubstance abuse (HCC) 12/27/2023   Cesarean delivery delivered 08/02/2020   Pregnancy 08/01/2020   Migraine with aura    Allergic bronchitis with acute exacerbation 10/08/2017   Seasonal allergies 10/08/2017   Bipolar 2 disorder, major depressive episode (HCC) 05/02/2016    Class: Chronic   Generalized anxiety disorder 12/09/2015   Benzodiazepine overdose 12/08/2015   Overdose 12/08/2015   Suicidal intent 12/08/2015   Hypotension 12/08/2015   Acute encephalopathy 12/08/2015   Drug overdose    Headache migraine type 11/23/2013      Social History   reports that she has quit smoking. She has been exposed to tobacco smoke. She has never used smokeless tobacco. She reports current alcohol use of about 2.0 - 4.0 standard drinks of alcohol per week. She reports current drug use. Frequency: 7.00 times per week. Drugs: Marijuana, Benzodiazepines, and Cocaine.   Family History   family history includes Cancer in her maternal grandmother; Diabetes in her maternal grandmother; Hypertension in her maternal grandmother; Lung disease in her father and paternal grandfather.   Medications: reviewed and updated   Objective:   Physical Exam BP 100/68   Pulse 76   Temp 97.9 F (36.6 C) (Oral)   Ht 5\' 2"  (1.575 m)   Wt 144 lb (65.3 kg)   LMP 02/13/2024 (Approximate)   SpO2 97%   BMI 26.34 kg/m   Physical Exam HENT:     Head: Normocephalic and atraumatic.     Nose: Nose normal.     Mouth/Throat:     Mouth: Mucous membranes are moist.     Pharynx: Oropharynx is clear.  Eyes:     Extraocular Movements: Extraocular movements intact.     Conjunctiva/sclera: Conjunctivae normal.     Pupils: Pupils are equal, round, and reactive to light.  Cardiovascular:     Rate and Rhythm: Normal rate and regular rhythm.     Pulses: Normal pulses.     Heart sounds: Normal heart sounds.  Pulmonary:     Effort: Pulmonary effort is normal.     Breath sounds: Normal breath sounds.  Musculoskeletal:        General: Normal range of motion.     Cervical back: Normal range of motion and neck supple.  Neurological:     General: No focal deficit present.     Mental Status: She is alert and oriented to person, place,  and time.  Psychiatric:        Mood and Affect: Mood normal.        Behavior: Behavior normal.        Assessment & Plan:  1. Encounter to establish care (Primary) - Patient presents today to establish care. During the interim follow-up with primary provider as scheduled.  - Return for annual physical examination, labs, and health maintenance. Arrive fasting meaning having no food for at least 8 hours prior to appointment. You may have only water or black coffee. Please take scheduled medications as normal.    Patient was given clear instructions to go to Emergency Department or return to medical center if symptoms don't improve, worsen, or new problems develop.The  patient verbalized understanding.  I discussed the assessment and treatment plan with the patient. The patient was provided an opportunity to ask questions and all were answered. The patient agreed with the plan and demonstrated an understanding of the instructions.   The patient was advised to call back or seek an in-person evaluation if the symptoms worsen or if the condition fails to improve as anticipated.    Ricky Stabs, NP 02/20/2024, 12:07 PM Primary Care at [facility]

## 2024-02-20 NOTE — Progress Notes (Signed)
 Patient states nothing to really talk about.

## 2024-03-25 ENCOUNTER — Encounter: Admitting: Family

## 2024-03-25 NOTE — Progress Notes (Signed)
 Erroneous encounter-disregard

## 2024-05-12 ENCOUNTER — Encounter: Payer: MEDICAID | Admitting: Family

## 2024-05-12 NOTE — Progress Notes (Signed)
 Erroneous encounter-disregard
